# Patient Record
Sex: Male | Born: 1970 | Hispanic: Yes | Marital: Married | Smoking: Never smoker
Health system: Southern US, Community
[De-identification: ages and names within clinical notes are randomized; demographics above are authoritative.]

---

## 2000-12-22 ENCOUNTER — Other Ambulatory Visit: Admission: RE | Admit: 2000-12-22 | Discharge: 2000-12-22 | Payer: Self-pay | Admitting: *Deleted

## 2001-03-15 ENCOUNTER — Other Ambulatory Visit: Admission: RE | Admit: 2001-03-15 | Discharge: 2001-03-15 | Payer: Self-pay | Admitting: *Deleted

## 2010-12-11 DIAGNOSIS — M2550 Pain in unspecified joint: Secondary | ICD-10-CM | POA: Insufficient documentation

## 2010-12-11 DIAGNOSIS — R700 Elevated erythrocyte sedimentation rate: Secondary | ICD-10-CM | POA: Insufficient documentation

## 2010-12-16 DIAGNOSIS — R7982 Elevated C-reactive protein (CRP): Secondary | ICD-10-CM | POA: Insufficient documentation

## 2010-12-16 DIAGNOSIS — M2550 Pain in unspecified joint: Secondary | ICD-10-CM

## 2010-12-16 DIAGNOSIS — R700 Elevated erythrocyte sedimentation rate: Secondary | ICD-10-CM

## 2010-12-22 ENCOUNTER — Other Ambulatory Visit: Payer: Self-pay | Admitting: Rheumatology

## 2010-12-22 ENCOUNTER — Encounter: Payer: Self-pay | Admitting: Internal Medicine

## 2010-12-22 ENCOUNTER — Ambulatory Visit
Admission: RE | Admit: 2010-12-22 | Discharge: 2010-12-22 | Disposition: A | Discharge status: Home / Self Care | Payer: Self-pay | Source: Ambulatory Visit | Attending: Rheumatology | Admitting: Rheumatology

## 2010-12-22 ENCOUNTER — Ambulatory Visit (INDEPENDENT_AMBULATORY_CARE_PROVIDER_SITE_OTHER): Payer: BC Managed Care – PPO | Admitting: Internal Medicine

## 2010-12-22 DIAGNOSIS — M069 Rheumatoid arthritis, unspecified: Secondary | ICD-10-CM

## 2010-12-22 DIAGNOSIS — M1990 Unspecified osteoarthritis, unspecified site: Secondary | ICD-10-CM

## 2010-12-22 DIAGNOSIS — F329 Major depressive disorder, single episode, unspecified: Secondary | ICD-10-CM

## 2010-12-22 NOTE — Assessment & Plan Note (Addendum)
I agree with the provisional diagnosis of rheumatoid arthritis. It would be exceedingly unusual for her and infection to cause this protracted, 8 month illness. I agree with Deveshwar's plans to treat with anti-inflammatory therapy. He had a chest x-ray done today which showed some small granulomas in the bases. He has no history of TB or known TB exposure. He has never had a TB skin test prior to 1 been placed in her office today. I will not do any further diagnostic testing today but will be available if he turns out to have latent tuberculosis.

## 2010-12-22 NOTE — Progress Notes (Signed)
  Subjective:    Patient ID: Jeffrey Herman, male    DOB: 1971-02-26, 40 y.o.   MRN: [id number]  HPI Jeffrey Herman is a 40 year old of [place] descent who works at the [employer]. He has been bothered by bilateral diffuse aching pain in his joints since last December. He said about 25 pounds unintentional weight loss. He had one episode of subjective fever about one week ago but no other episodes of fever, chills, or sweats. He has no history of tuberculosis. He has not had any problem with cough. He has not had any problem with rash. He was referred to me by Dr. Toni Arthurs. I also note that he recently saw Dr. Pollyann Savoy who gave him a provisional diagnosis of rheumatoid arthritis based on his elevated rheumatoid factor and clinical presentation. He had a negative hepatitis panel and a negative HIV antibody in her office. He started on a prednisone taper 4 days ago and feels remarkably better. He and his wife tell me that he was unable to walk and had to be transported by wheelchair last week. Now he can walk briskly with only minimal assistance with his crutches.    Review of Systems  Constitutional: Positive for fever, activity change, appetite change, fatigue and unexpected weight change. Negative for chills and diaphoresis.  HENT: Negative for sore throat, mouth sores, neck pain and neck stiffness.   Eyes: Negative for redness and visual disturbance.  Respiratory: Negative for cough and shortness of breath.   Cardiovascular: Positive for leg swelling. Negative for chest pain.  Gastrointestinal: Negative for nausea, vomiting, abdominal pain and diarrhea.  Genitourinary: Negative for dysuria, urgency, frequency and discharge.  Musculoskeletal: Positive for myalgias, joint swelling and arthralgias.  Skin: Negative for rash.  Neurological: Positive for weakness. Negative for headaches.  Psychiatric/Behavioral: Negative.        Objective:   Physical Exam  Constitutional: No  distress.  HENT:  Mouth/Throat: Oropharynx is clear and moist. No oropharyngeal exudate.  Eyes: Conjunctivae are normal.  Neck: Neck supple.  Cardiovascular: Normal rate, regular rhythm and normal heart sounds.   No murmur heard. Pulmonary/Chest: Breath sounds normal. He has no wheezes.  Abdominal: Soft. Bowel sounds are normal. There is no tenderness.  Musculoskeletal:       He has some mild deformity of his DIP joints without any evidence of acute inflammation. His left ankle is slightly warm compared to his other joints but is not red and has no swelling.  Lymphadenopathy:    He has no cervical adenopathy.  Skin: No rash noted.  Psychiatric: He has a normal mood and affect.          Assessment & Plan:

## 2012-11-07 IMAGING — CR DG CHEST 2V
2 series · 2 of 2 positions shown · non-contrast
Comparison: None.

CLINICAL DATA: Beginning immunosuppressant drug therapy

CHEST - 2 VIEW

[w chest pa]
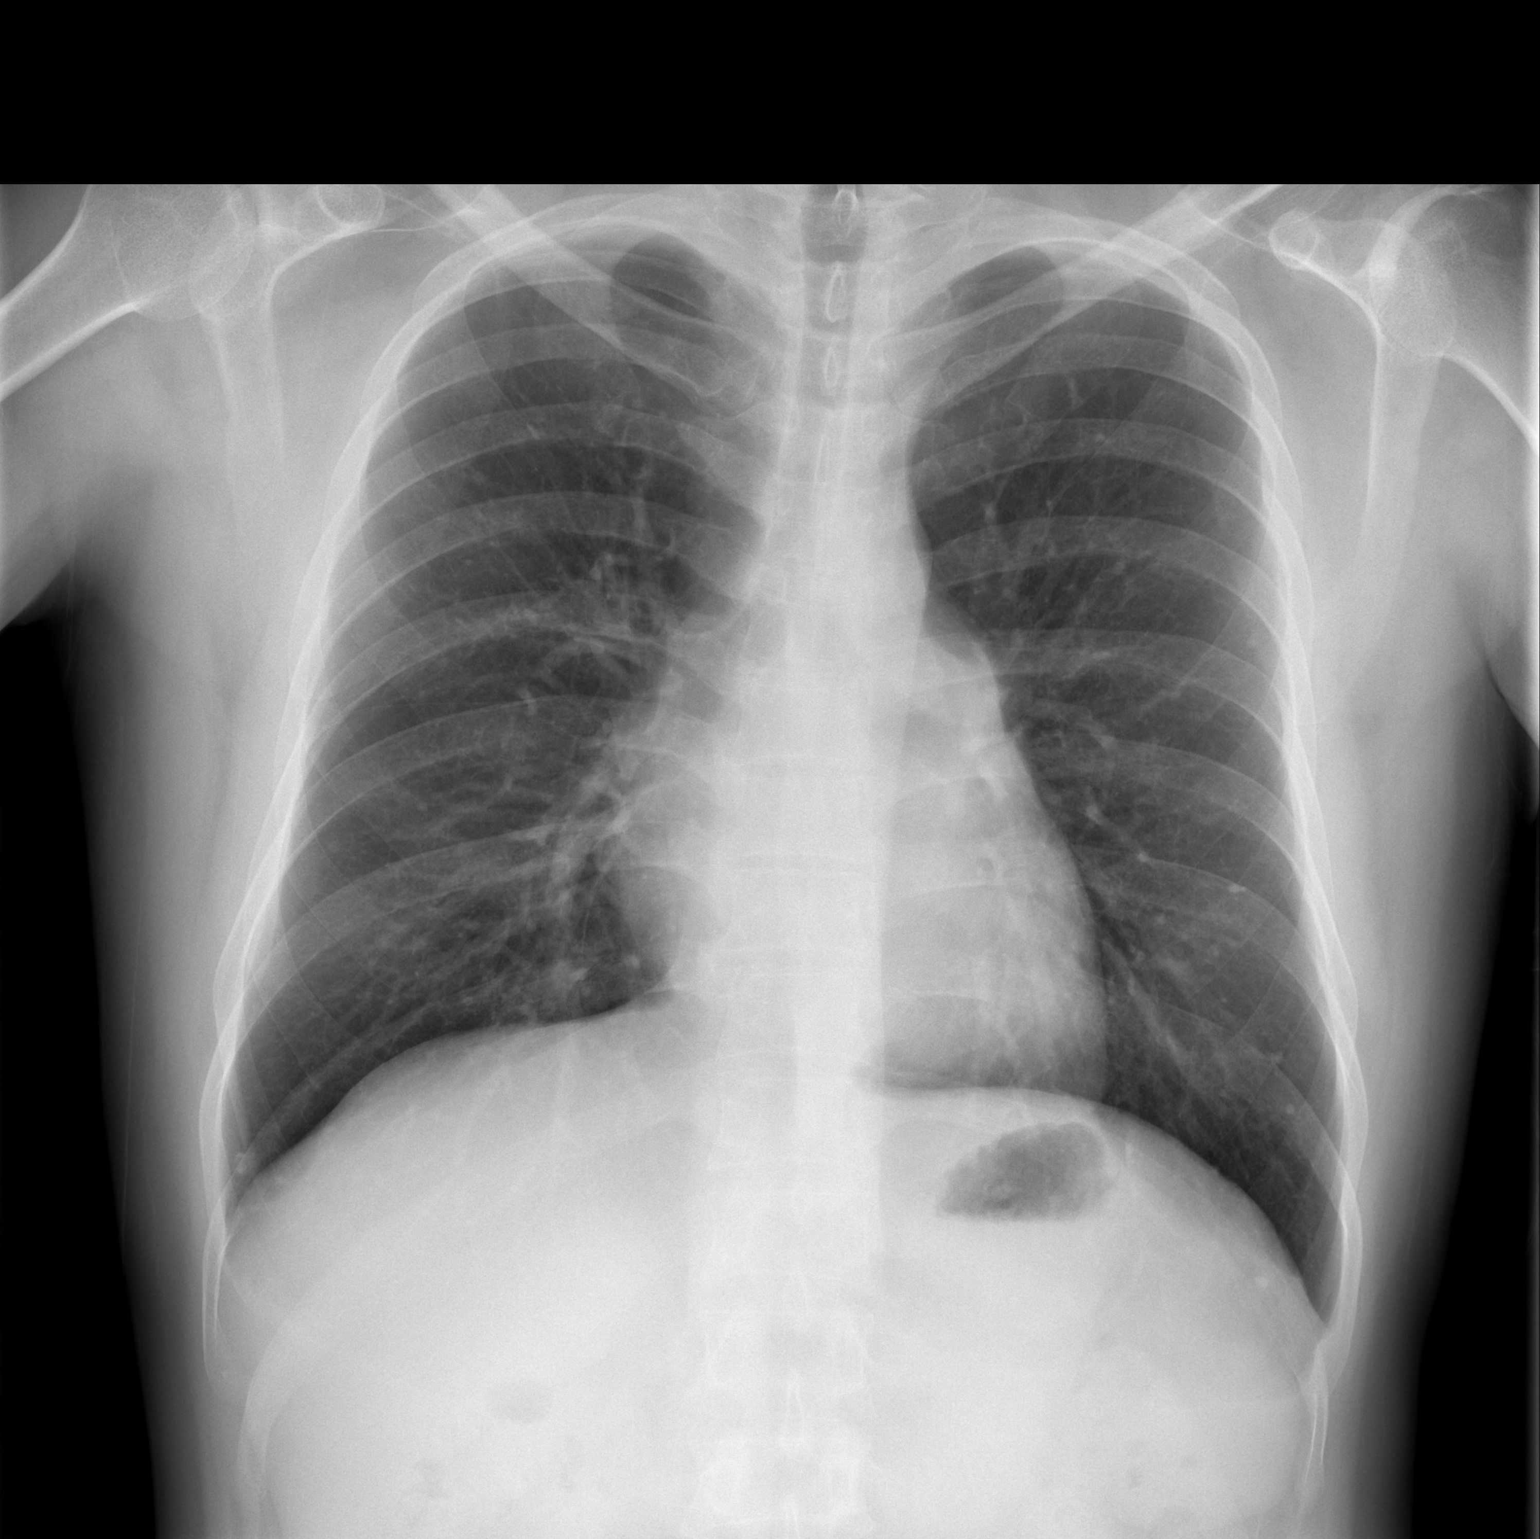

[w chest lat]
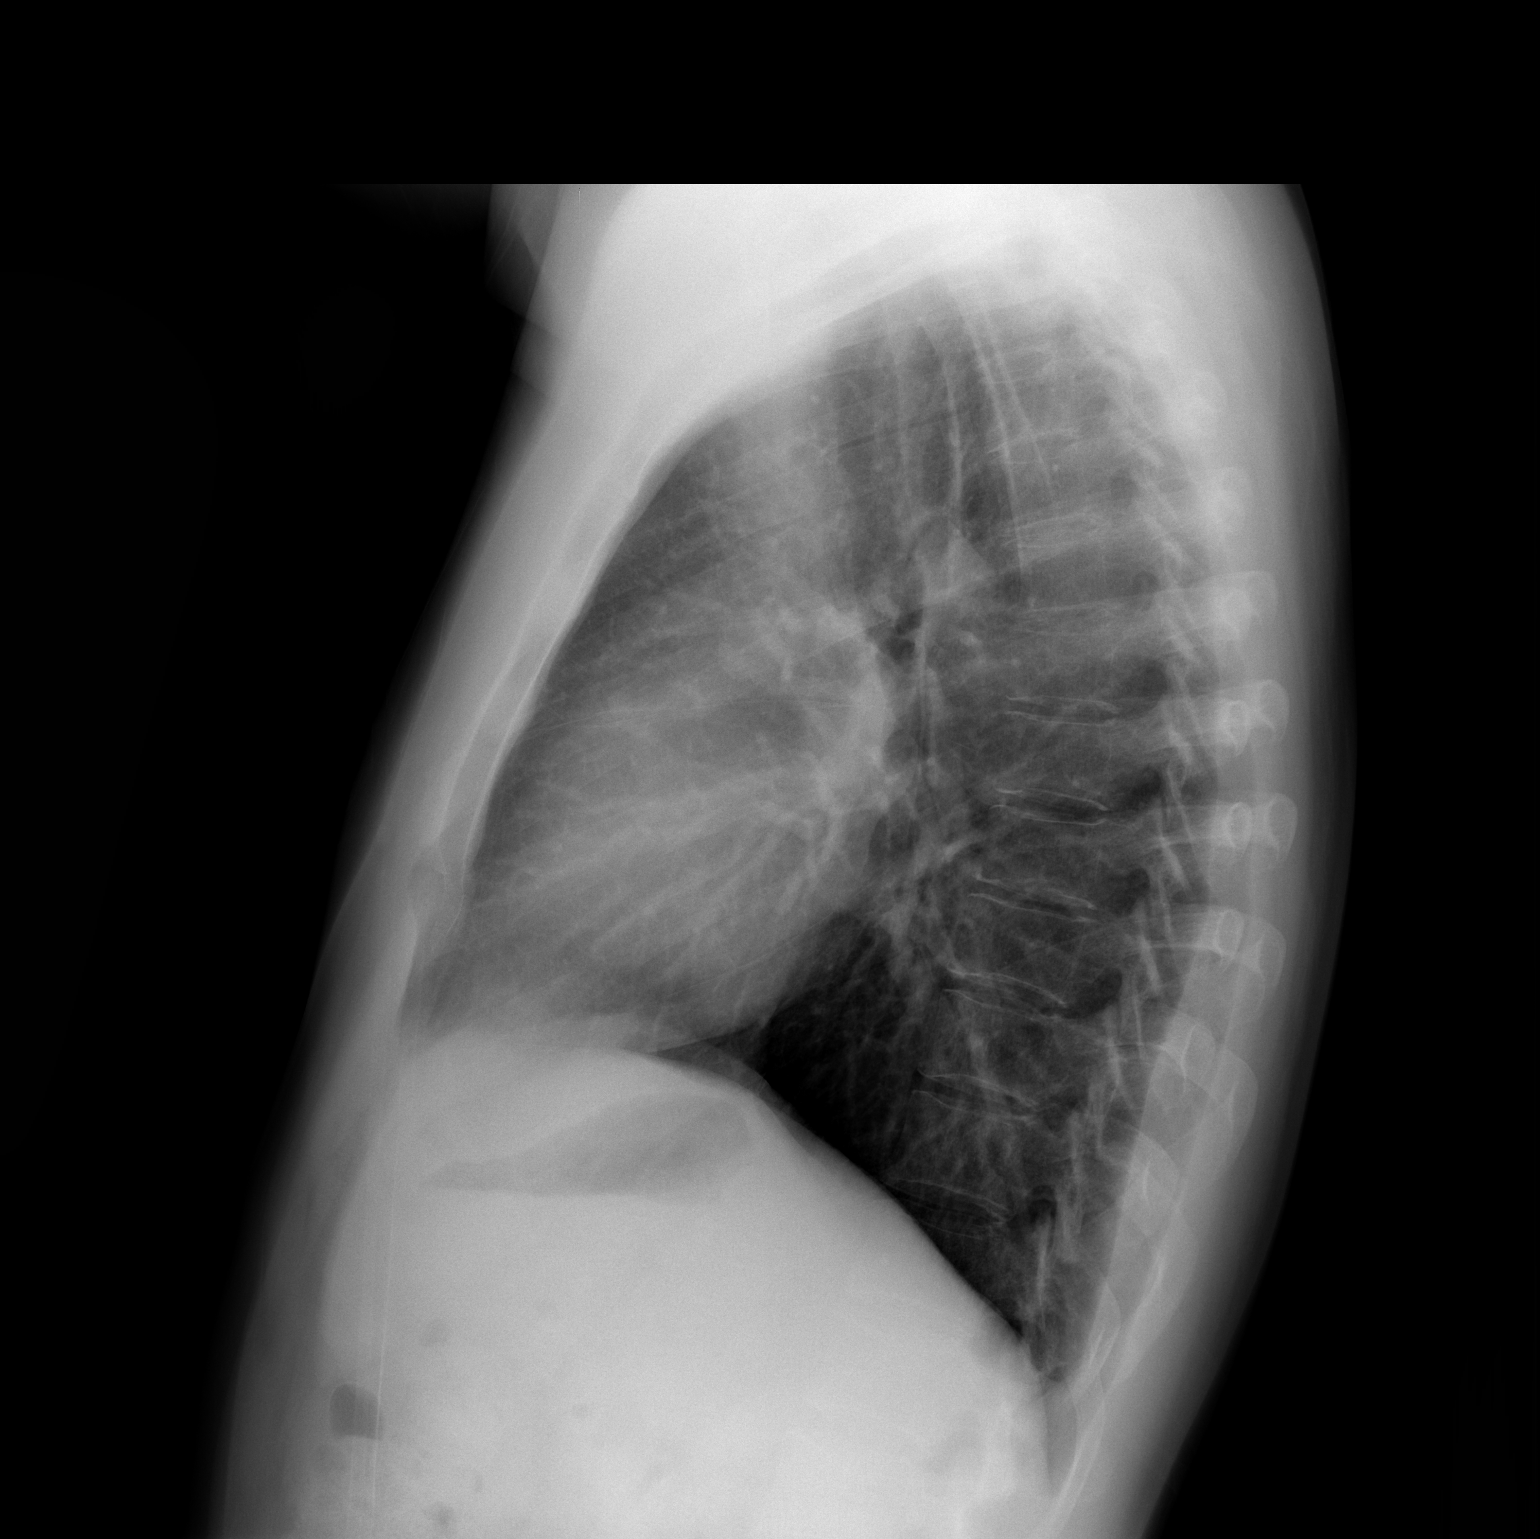

[2 of 2 positions shown; findings below may reference images not displayed]

FINDINGS: The lungs are clear.  There are small faintly calcified
nodular opacities at the lung bases left more numerous than right
most consistent with calcified granulomas  probably due to prior
granulomatous disease.  No active infiltrate or effusion is seen.
Mediastinal contours appear normal.  The heart is within normal
limits in size.  No bony abnormality is seen.
IMPRESSION: Probable small calcified granulomas at the lung bases.  No definite
active process.

## 2016-05-14 ENCOUNTER — Encounter: Payer: Self-pay | Admitting: Rheumatology

## 2016-06-16 ENCOUNTER — Other Ambulatory Visit: Payer: Self-pay | Admitting: Rheumatology

## 2016-06-24 ENCOUNTER — Other Ambulatory Visit: Payer: Self-pay | Admitting: Rheumatology

## 2016-06-24 MED ORDER — ADALIMUMAB 40 MG/0.8ML ~~LOC~~ AJKT: 0.8000 mL | AUTO-INJECTOR | SUBCUTANEOUS | 0 refills | Status: DC

## 2016-06-24 NOTE — Telephone Encounter (Signed)
Last Visit: 02/11/16 Next Visit: 07/27/16 Labs: 05/14/16 WNL TB Gold: 10/18/15 Neg  Okay to refill Humira?

## 2016-06-24 NOTE — Telephone Encounter (Signed)
[facility] called about refill request for Humira. They are requesting a call back at [phone].

## 2016-07-27 ENCOUNTER — Ambulatory Visit: Payer: Self-pay | Admitting: Rheumatology

## 2016-08-12 ENCOUNTER — Ambulatory Visit (INDEPENDENT_AMBULATORY_CARE_PROVIDER_SITE_OTHER): Payer: BLUE CROSS/BLUE SHIELD | Admitting: Rheumatology

## 2016-08-12 ENCOUNTER — Encounter: Payer: Self-pay | Admitting: Rheumatology

## 2016-08-12 VITALS — BP 131/89 | HR 75 | Resp 13 | Ht 65.0 in | Wt 149.0 lb

## 2016-08-12 DIAGNOSIS — M24521 Contracture, right elbow: Secondary | ICD-10-CM | POA: Diagnosis not present

## 2016-08-12 DIAGNOSIS — Z79899 Other long term (current) drug therapy: Secondary | ICD-10-CM

## 2016-08-12 DIAGNOSIS — M0579 Rheumatoid arthritis with rheumatoid factor of multiple sites without organ or systems involvement: Secondary | ICD-10-CM | POA: Diagnosis not present

## 2016-08-12 DIAGNOSIS — M24522 Contracture, left elbow: Secondary | ICD-10-CM

## 2016-08-12 DIAGNOSIS — M19042 Primary osteoarthritis, left hand: Secondary | ICD-10-CM

## 2016-08-12 DIAGNOSIS — M19041 Primary osteoarthritis, right hand: Secondary | ICD-10-CM | POA: Insufficient documentation

## 2016-08-12 MED ORDER — ADALIMUMAB 40 MG/0.8ML ~~LOC~~ AJKT: 0.8000 mL | AUTO-INJECTOR | SUBCUTANEOUS | 0 refills | Status: DC

## 2016-08-12 MED ORDER — SULFASALAZINE 500 MG PO TABS: 500.0000 mg | ORAL_TABLET | Freq: Four times a day (QID) | ORAL | 1 refills | Status: DC

## 2016-08-12 NOTE — Progress Notes (Signed)
Office Visit Note  Patient: Jeffrey Herman             Date of Birth: 04/21/1971           MRN: [id number]             PCP: No PCP Per Patient Referring: No ref. provider found Visit Date: 08/12/2016 Occupation: @GUAROCC @    Subjective:  Follow-up  History of Present Illness: Jeffrey Herman is a 46 y.o. male   Last seen in our office 02/11/2016.  Patient is currently doing really well with his arthritis. No joint pain swelling or stiffness beyond baseline. The combination of Humira and sulfasalazine are working well for the patient.  [facility]. His last labs were from December 2017 and have been scanned into his chart.  Activities of Daily Living:  Patient reports morning stiffness for 15 minutes.   Patient Denies nocturnal pain.  Difficulty dressing/grooming: Denies Difficulty climbing stairs: Denies Difficulty getting out of chair: Denies Difficulty using hands for taps, buttons, cutlery, and/or writing: Denies   Review of Systems  Constitutional: Negative for fatigue.  HENT: Negative for mouth sores and mouth dryness.   Eyes: Negative for dryness.  Respiratory: Negative for shortness of breath.   Gastrointestinal: Negative for constipation and diarrhea.  Musculoskeletal: Negative for myalgias and myalgias.  Skin: Negative for sensitivity to sunlight.  Neurological: Negative for memory loss.  Psychiatric/Behavioral: Negative for sleep disturbance.    PMFS History:  Patient Active Problem List   Diagnosis Date Noted  . High risk medication use 08/12/2016  . 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 Contracture of elbow joint, right 08/12/2016  . Primary osteoarthritis of both hands 08/12/2016  . Rheumatoid arthritis (HCC) 12/22/2010  . Depression 12/22/2010  . Elevated C-reactive protein (CRP) 12/16/2010  . Elevated sedimentation rate 12/11/2010  . Arthralgia of multiple sites 12/11/2010    History reviewed. No  pertinent past medical history.  History reviewed. No pertinent family history. History reviewed. No pertinent surgical history. Social History   Social History Narrative  . No narrative on file     Objective: Vital Signs: BP 131/89   Pulse 75   Resp 13   Ht 5\' 5"  (1.651 m)   Wt 149 lb (67.6 kg)   BMI 24.79 kg/m    Physical Exam  Constitutional: He is oriented to person, place, and time. He appears well-developed and well-nourished.  HENT:  Head: Normocephalic and atraumatic.  Eyes: Conjunctivae and EOM are normal. Pupils are equal, round, and reactive to light.  Neck: Normal range of motion. Neck supple.  Cardiovascular: Normal rate, regular rhythm and normal heart sounds.  Exam reveals no gallop and no friction rub.   No murmur heard. Pulmonary/Chest: Effort normal and breath sounds normal. No respiratory distress. He has no wheezes. He has no rales. He exhibits no tenderness.  Abdominal: Soft. He exhibits no distension and no mass. There is no tenderness. There is no guarding.  Musculoskeletal: Normal range of motion.  Lymphadenopathy:    He has no cervical adenopathy.  Neurological: He is alert and oriented to person, place, and time. He exhibits normal muscle tone. Coordination normal.  Skin: Skin is warm and dry. Capillary refill takes less than 2 seconds. No rash noted.  Psychiatric: He has a normal mood and affect. His behavior is normal. Judgment and thought content normal.  Vitals reviewed.    Musculoskeletal Exam:  Full range of motion of all  joints Grip strength is equal and strong bilaterally Fibromyalgia tender points are all absent  CDAI Exam: CDAI Homunculus Exam:   Joint Counts:  CDAI Tender Joint count: 0 CDAI Swollen Joint count: 0  No synovitis on examination   Investigation: Findings:  Labs from Oct 11, 2015:  TB Gold was negative.  CMP with GFR was normal.  CBC with diff was normal.  RAPID-3 shows a raw score of 0.  The patient is doing  quite well with his rheumatoid arthritis.  No visits with results within 6 Month(s) from this visit.  Latest known visit with results is:  No results found for any previous visit.    Imaging: No results found.  Speciality Comments: No specialty comments available.   Procedures:  No procedures performed Allergies: Patient has no known allergies.   Assessment / Plan:     Visit Diagnoses: Rheumatoid arthritis involving multiple sites with positive rheumatoid factor (HCC) - No synovitis on exam.  Negative CCP.  Negative ANA.  - Plan: CBC with Differential/Platelet, COMPLETE METABOLIC PANEL WITH GFR, Quantiferon tb gold assay (blood), CBC with Differential/Platelet, COMPLETE METABOLIC PANEL WITH GFR, Quantiferon tb gold assay (blood)  High risk medication use - Humira-40mg /0.8ML PNKT  every 3 weeks // sulfasalazine 2 b.i.d.// adequate response. - Plan: CBC with Differential/Platelet, COMPLETE METABOLIC PANEL WITH GFR, CBC with Differential/Platelet, COMPLETE METABOLIC PANEL WITH GFR, Quantiferon tb gold assay (blood), CBC with Differential/Platelet, COMPLETE METABOLIC PANEL WITH GFR, Quantiferon tb gold assay (blood)  Contracture of elbow joint, left  Contracture of elbow joint, right  Primary osteoarthritis of both hands - .  DIP and PIP prominence bilaterally   Plan: #1: Rheumatoid arthritis Rheumatoid factor positive, CCP negative, ANA negative. Currently on Humira every 3 weeks. On sulfasalazine 500 mg, 2 pills twice a day Both this combination of medication is giving the patient adequate response Last labs were done in [facility] on December 2017 and labs were normal  #2: High risk prescription Humira every 3 weeks (patient states that he is only getting 2 pens at a time and not lasting him for 3 months and has to call to get the remainder shipment in. I advised the patient that when we wrote for the prescription last time, it was 4 pens and asked what he should've  received. Therefore his translator today we'll get on the phone and speak with them so that patient gets 4 pens. This quantity will allow patient to have a 12 weeks supply. Sulfasalazine EN 500 mg; 2 pills twice a day.  I have refilled both of these medications for him and given him 90 day supply. Sulfasalazine also has a refill.   #3: CBC with differential and CMP with GFR today in office  #4: CBC with differential and CMP with GFR and TB Gold do late May early June and patient plans to get that done at [facility] and have them send it to [place]. I have given him hard copy of the lab request.  #5: Return to clinic in 6 months. I've asked the patient to return in 5 months and she would prefer 6 months. Since he is doing so well with his joints and the current combination of medications is adequately addressing his rheumatoid arthritis needs, 6 months is acceptable for now unless he has a flare and then we will make adjustments if needed.  Orders: Orders Placed This Encounter  Procedures  . CBC with Differential/Platelet  . COMPLETE METABOLIC PANEL WITH GFR  .  CBC with Differential/Platelet  . COMPLETE METABOLIC PANEL WITH GFR  . Quantiferon tb gold assay (blood)   Meds ordered this encounter  Medications  . Adalimumab (HUMIRA PEN) 40 MG/0.8ML PNKT    Sig: Inject 0.8 mLs into the skin every 21 ( twenty-one) days.    Dispense:  4 each    Refill:  0    Order Specific Question:   Supervising Provider    Answer:   Vanessa Kick  . sulfaSALAzine (AZULFIDINE) 500 MG tablet    Sig: Take 1 tablet (500 mg total) by mouth 4 (four) times daily.    Dispense:  360 tablet    Refill:  1    Please give enteric coated ssz en 500mg  tablet    Order Specific Question:   Supervising Provider    Answer:   Pollyann Savoy [phone]    Face-to-face time spent with patient was 30 minutes. 50% of time was spent in counseling and coordination of care.  Follow-Up Instructions:  Return in about 6 months (around 02/12/2017).   Tawni Pummel, PA-C  Note - This record has been created using [employer].  Chart creation errors have been sought, but may not always  have been located. Such creation errors do not reflect on  the standard of medical care.

## 2016-08-13 LAB — CBC WITH DIFFERENTIAL/PLATELET
BASOS ABS: 0 {cells}/uL (ref 0–200)
Basophils Relative: 0 %
EOS PCT: 3 %
Eosinophils Absolute: 186 cells/uL (ref 15–500)
HCT: 41 % (ref 38.5–50.0)
HEMOGLOBIN: 13.9 g/dL (ref 13.2–17.1)
LYMPHS ABS: 2356 {cells}/uL (ref 850–3900)
Lymphocytes Relative: 38 %
MCH: 27.8 pg (ref 27.0–33.0)
MCHC: 33.9 g/dL (ref 32.0–36.0)
MCV: 82 fL (ref 80.0–100.0)
MONOS PCT: 8 %
MPV: 10.3 fL (ref 7.5–12.5)
Monocytes Absolute: 496 cells/uL (ref 200–950)
NEUTROS ABS: 3162 {cells}/uL (ref 1500–7800)
NEUTROS PCT: 51 %
PLATELETS: 212 10*3/uL (ref 140–400)
RBC: 5 MIL/uL (ref 4.20–5.80)
RDW: 15.7 % — ABNORMAL HIGH (ref 11.0–15.0)
WBC: 6.2 10*3/uL (ref 3.8–10.8)

## 2016-08-13 LAB — COMPLETE METABOLIC PANEL WITH GFR
ALBUMIN: 4.1 g/dL (ref 3.6–5.1)
ALT: 21 U/L (ref 9–46)
AST: 26 U/L (ref 10–40)
Alkaline Phosphatase: 72 U/L (ref 40–115)
BUN: 15 mg/dL (ref 7–25)
CHLORIDE: 105 mmol/L (ref 98–110)
CO2: 24 mmol/L (ref 20–31)
Calcium: 9 mg/dL (ref 8.6–10.3)
Creat: 0.88 mg/dL (ref 0.60–1.35)
GFR, Est African American: >89 mL/min (ref >=60)
GLUCOSE: 111 mg/dL — AB (ref 65–99)
POTASSIUM: 4.3 mmol/L (ref 3.5–5.3)
SODIUM: 139 mmol/L (ref 135–146)
Total Bilirubin: 0.4 mg/dL (ref 0.2–1.2)
Total Protein: 7.8 g/dL (ref 6.1–8.1)

## 2016-09-04 ENCOUNTER — Other Ambulatory Visit: Payer: Self-pay | Admitting: Rheumatology

## 2016-09-04 MED ORDER — ADALIMUMAB 40 MG/0.8ML ~~LOC~~ AJKT: 1.0000 "pen " | AUTO-INJECTOR | SUBCUTANEOUS | 2 refills | Status: DC

## 2016-09-04 NOTE — Telephone Encounter (Signed)
08/12/16 last visit  02/11/17 next visit  Labs 08/12/16 normal TB gold due in June  Was neg June 2017 Ok to refill per Dr Corliss Skains

## 2016-09-04 NOTE — Telephone Encounter (Signed)
Patient called requesting a refill on his Humira.  [facility]  CB# [phone].  Thank you

## 2016-09-18 ENCOUNTER — Encounter: Payer: Self-pay | Admitting: Rheumatology

## 2016-09-23 ENCOUNTER — Telehealth: Payer: Self-pay | Admitting: *Deleted

## 2016-09-23 NOTE — Telephone Encounter (Signed)
Attempted to return patient's call and no answer.

## 2016-09-23 NOTE — Telephone Encounter (Signed)
Patient returning Andrea's call. °

## 2016-09-23 NOTE — Telephone Encounter (Signed)
CBC and CMP drawn on 09/18/16 WNL except non fasting glucose 115  Unable to leave patient a message.

## 2016-09-30 NOTE — Telephone Encounter (Signed)
Received TB Gold- results are negative.  Attempted to contact the patient and unable to leave a message.

## 2017-01-13 ENCOUNTER — Other Ambulatory Visit: Payer: Self-pay | Admitting: Rheumatology

## 2017-01-14 NOTE — Telephone Encounter (Signed)
08/12/16 last visit  02/11/17 next visit  Labs 09/18/16 normal

## 2017-01-15 NOTE — Telephone Encounter (Signed)
Attempted to contact the patient to advise he is due for labs. Unable to leave a message.

## 2017-01-19 NOTE — Telephone Encounter (Signed)
Attempted to contact the patient and unable to leave a message.  

## 2017-01-20 NOTE — Telephone Encounter (Signed)
[facility] calling in reference to Humira 40mg  pen. Requesting a refill phone # [phone] fax# [phone]

## 2017-01-22 NOTE — Telephone Encounter (Signed)
ok 

## 2017-01-22 NOTE — Telephone Encounter (Signed)
Patient is aware he is due for labs.   08/12/16 last visit  05/05/17 next visit  Labs 09/18/16 normal TB Gold: 09/18/16 Neg  Okay to refill 30 Supply Humira?

## 2017-02-04 ENCOUNTER — Other Ambulatory Visit: Payer: Self-pay | Admitting: Rheumatology

## 2017-02-05 NOTE — Telephone Encounter (Addendum)
08/12/16 last visit  05/05/17 next visit  Labs 09/18/16 normal TB Gold: 09/18/16 Neg  Patient is due for labs. Spoke with patient and he states he will come next week to update labs.  Okay to refill 30 supply day supply Humira?

## 2017-02-10 ENCOUNTER — Telehealth: Payer: Self-pay | Admitting: Rheumatology

## 2017-02-10 NOTE — Telephone Encounter (Signed)
Pharmacy calling requesting a refill on patients Humira  pen. Fax#[phone]

## 2017-02-10 NOTE — Telephone Encounter (Signed)
ok 

## 2017-02-11 ENCOUNTER — Ambulatory Visit: Payer: BLUE CROSS/BLUE SHIELD | Admitting: Rheumatology

## 2017-02-11 NOTE — Telephone Encounter (Signed)
Prescription sent to the pharmacy.

## 2017-03-01 ENCOUNTER — Telehealth: Payer: Self-pay | Admitting: Rheumatology

## 2017-03-01 NOTE — Telephone Encounter (Signed)
Patient going to [facility] in [place] tomorrow for labs. Please send orders.

## 2017-03-02 ENCOUNTER — Other Ambulatory Visit: Payer: Self-pay | Admitting: *Deleted

## 2017-03-02 DIAGNOSIS — Z79899 Other long term (current) drug therapy: Secondary | ICD-10-CM

## 2017-03-02 NOTE — Telephone Encounter (Signed)
Lab orders faxed.

## 2017-03-08 ENCOUNTER — Telehealth: Payer: Self-pay | Admitting: Rheumatology

## 2017-03-08 NOTE — Telephone Encounter (Signed)
Patient calling requesting lab results. Please call to advise.

## 2017-03-09 NOTE — Telephone Encounter (Signed)
Attempted to contact the patient and left message for patient to call the office.  

## 2017-03-16 ENCOUNTER — Other Ambulatory Visit: Payer: Self-pay | Admitting: Rheumatology

## 2017-03-16 NOTE — Telephone Encounter (Signed)
Attempted to contact the patient and unable to leave a message.  

## 2017-03-16 NOTE — Telephone Encounter (Signed)
Patient needs a refill on Fulfasalazind 500mg , and Humira. [facility]

## 2017-03-19 MED ORDER — SULFASALAZINE 500 MG PO TABS: 1000.0000 mg | ORAL_TABLET | Freq: Two times a day (BID) | ORAL | 0 refills | Status: DC

## 2017-03-19 MED ORDER — ADALIMUMAB 40 MG/0.4ML ~~LOC~~ PSKT: 40.0000 mg | PREFILLED_SYRINGE | SUBCUTANEOUS | 0 refills | Status: DC

## 2017-03-19 NOTE — Telephone Encounter (Signed)
Ok to give 30d

## 2017-03-19 NOTE — Telephone Encounter (Signed)
Last Visit: 08/12/16 Next Visit: 05/05/17 Labs: 03/02/17 AST 45.7 previously 26.1  TB Gold: 09/18/16 Neg  No Synovitis on last exam  Okay to refill Humira and SSZ?

## 2017-03-22 ENCOUNTER — Other Ambulatory Visit: Payer: Self-pay | Admitting: Rheumatology

## 2017-03-25 ENCOUNTER — Telehealth: Payer: Self-pay

## 2017-03-25 NOTE — Telephone Encounter (Signed)
Patient would like a Rx refill on SSZ and Humira [facility]  Cb# [phone].  Please advise.  Thank you.

## 2017-03-25 NOTE — Telephone Encounter (Signed)
Attempted to contact the patient and unable to leave a message.  

## 2017-03-29 NOTE — Telephone Encounter (Signed)
Attempted to contact the patient and unable to leave a message. Prescriptions were sent to the pharmacy on 03/19/17.

## 2017-03-30 ENCOUNTER — Other Ambulatory Visit: Payer: Self-pay | Admitting: Rheumatology

## 2017-03-30 ENCOUNTER — Telehealth: Payer: Self-pay | Admitting: Rheumatology

## 2017-03-30 NOTE — Telephone Encounter (Deleted)
Patient needs a refill on Sulfasalazine 500 mg. [facility] Please call if questions.

## 2017-03-30 NOTE — Telephone Encounter (Signed)
Opened in error

## 2017-04-01 ENCOUNTER — Telehealth: Payer: Self-pay | Admitting: Rheumatology

## 2017-04-01 NOTE — Telephone Encounter (Signed)
Daughter calling for patient in reference to rx for Sulfasalazine. Per [facility] not [facility]. Please call to advise.

## 2017-04-02 MED ORDER — SULFASALAZINE 500 MG PO TABS: 1000.0000 mg | ORAL_TABLET | Freq: Two times a day (BID) | ORAL | 0 refills | Status: DC

## 2017-04-02 NOTE — Telephone Encounter (Signed)
Prescription re-sent to pharmacy.

## 2017-04-05 ENCOUNTER — Other Ambulatory Visit: Payer: Self-pay | Admitting: Rheumatology

## 2017-04-12 ENCOUNTER — Other Ambulatory Visit: Payer: Self-pay | Admitting: Rheumatology

## 2017-04-13 NOTE — Telephone Encounter (Signed)
Last Visit: 08/12/16 Next Visit: 05/05/17 Labs: 03/02/17 AST 45.7 previously 26.1  TB Gold: 09/18/16 Neg  Okay to refill per Dr. Corliss Skainseveshwar

## 2017-04-16 ENCOUNTER — Telehealth: Payer: Self-pay

## 2017-04-16 NOTE — Telephone Encounter (Signed)
Patient was calling concerning Rx for SSZ.  Stated that he only got a Rx for a 30 day supply.  Would like a 90 day supply. CB# is [phone].  Please advise.  Thank you.

## 2017-04-19 NOTE — Telephone Encounter (Signed)
Patient advised after he will receive a 90 day supply after he comes for his appointment.

## 2017-04-24 NOTE — Progress Notes (Signed)
Office Visit Note  Patient: Jeffrey Herman             Date of Birth: 06/04/1970           MRN: [id number]             PCP: Patient, No Pcp Per Referring: No ref. provider found Visit Date: 05/05/2017 Occupation: @GUAROCC @  Interpreter: Arta Bruceeynaldo Diaz   Subjective:   left foot pain   History of Present Illness: Jeffrey Herman is a 46 y.o. male with history of sero positive rheumatoid arthritis and osteoarthritis. He states he continues to have some discomfort in his elbows. He has intermittent pain and swelling in his left foot. He states about 1-1/2 month ago he had swelling and pain in his left third toe. It is not swollen now. He states that he missed 3 doses of sulfasalazine due to insurance coverage and refills. His last Humira injection was on December 6. He has ran out of his Humira refills. Patient reports that she developed a rash on his scalp and he was given Keflex by his PCP. Is taking the medication right now.  Activities of Daily Living:  Patient reports morning stiffness for 0 minute.   Patient Denies nocturnal pain.  Difficulty dressing/grooming: Denies Difficulty climbing stairs: Denies Difficulty getting out of chair: Denies Difficulty using hands for taps, buttons, cutlery, and/or writing: Denies   Review of Systems  Constitutional: Positive for fatigue. Negative for night sweats and weakness ( ).  HENT: Negative for mouth sores, mouth dryness and nose dryness.   Eyes: Negative for redness and dryness.  Respiratory: Negative for shortness of breath and difficulty breathing.   Cardiovascular: Negative for chest pain, palpitations, hypertension, irregular heartbeat and swelling in legs/feet.  Gastrointestinal: Negative for constipation and diarrhea.  Endocrine: Negative for increased urination.  Musculoskeletal: Positive for arthralgias and joint pain. Negative for joint swelling, myalgias, muscle weakness, morning stiffness, muscle tenderness and myalgias.  Skin:  Negative for color change, rash, hair loss, nodules/bumps, skin tightness, ulcers and sensitivity to sunlight.  Allergic/Immunologic: Negative for susceptible to infections.  Neurological: Negative for dizziness, fainting, memory loss and night sweats.  Hematological: Negative for swollen glands.  Psychiatric/Behavioral: Negative for depressed mood and sleep disturbance. The patient is not nervous/anxious.     PMFS History:  Patient Active Problem List   Diagnosis Date Noted  . High risk medication use 08/12/2016  . 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 Contracture of elbow joint, right 08/12/2016  . Primary osteoarthritis of both hands 08/12/2016  . Rheumatoid arthritis (HCC) 12/22/2010  . Depression 12/22/2010  . Elevated C-reactive protein (CRP) 12/16/2010  . Elevated sedimentation rate 12/11/2010  . Arthralgia of multiple sites 12/11/2010    History reviewed. No pertinent past medical history.  History reviewed. No pertinent family history. History reviewed. No pertinent surgical history. Social History   Social History Narrative  . Not on file     Objective: Vital Signs: BP 127/82 (BP Location: Left Arm, Patient Position: Sitting, Cuff Size: Normal)   Pulse 88   Resp 15   Ht 5\' 5"  (1.651 m)   Wt 150 lb (68 kg)   BMI 24.96 kg/m    Physical Exam  Constitutional: He is oriented to person, place, and time. He appears well-developed and well-nourished.  HENT:  Head: Normocephalic and atraumatic.  Eyes: Conjunctivae and EOM are normal. Pupils are equal, round, and reactive to light.  Neck: Normal range of motion. Neck supple.  Cardiovascular: Normal  rate, regular rhythm and normal heart sounds.  Pulmonary/Chest: Effort normal and breath sounds normal.  Abdominal: Soft. Bowel sounds are normal.  Neurological: He is alert and oriented to person, place, and time.  Skin: Skin is warm and dry. Capillary refill takes less than 2 seconds.  Psychiatric: He has a  normal mood and affect. His behavior is normal.  Nursing note and vitals reviewed.    Musculoskeletal Exam: C-spine and thoracic lumbar spine good range of motion. Shoulder joints elbow joints wrist joint MCPs PIPs DIPs are good range of motion. He has some DIP PIP thickening but no synovitis was noted. Hip joints knee joints ankles MTPs PIPs DIPs with good range of motion with no synovitis. He is about 2 contracture in his left elbow and about 10 contracture in his right elbow.  CDAI Exam: CDAI Homunculus Exam:   Joint Counts:  CDAI Tender Joint count: 0 CDAI Swollen Joint count: 0  Global Assessments:  Patient Global Assessment: 5 Provider Global Assessment: 2  CDAI Calculated Score: 7    Investigation: No additional findings. Labs: 03/02/2017 elevated AST 45.7,  previous 09/18/2016 26.1 TB Gold: 09/18/2016 Negative  Imaging: No results found.  Speciality Comments: No specialty comments available.    Procedures:  No procedures performed Allergies: Patient has no known allergies.   Assessment / Plan:     Visit Diagnoses: Rheumatoid arthritis involving multiple sites with positive rheumatoid factor (HCC) - +RF, -CCP, -ANA. He is doing much better on Humira and sulfasalazine combination. He has no synovitis on examination. He states he ran out of sulfasalazine about 3 weeks and he does not have Humira refills anymore. As some clinically he is doing better and has elevation of LFTs and would like to hold off sulfasalazine for right now and keep him on Humira monotherapy.  High risk medication use - Humira 40 mg sq 21 days, SSZ 500 mg 2 tabs po bid. LFTs were elevated in October. He will get labs in January and then every 3 months to monitor for drug toxicity. His TB gold is due in May 2019. His LFTs were elevated recently. I've advised to discontinue sulfasalazine and continue with Humira. We will decrease the interval between the Humira injections to every other week. A sample for  Humira injection was given  Primary osteoarthritis of both hands: He has some stiffness in his hands.  Contracture of elbow joint, left: Better  Contracture of elbow joint, right he has tended contracture in his right elbow without discomfort.  History of depression  Psoriasis : I do not see any active lesions currently.  Scalp infection: I do not see any active lesions but she was placed on Keflex by his PCP for infection. He uses a hat at work. I advised him to avoid using hat to prevent sweating and infection.   Orders: No orders of the defined types were placed in this encounter.  No orders of the defined types were placed in this encounter.   Face-to-face time spent with patient was 30 minutes. Greater than 50% of time was spent in counseling and coordination of care.  Follow-Up Instructions: Return in about 5 months (around 10/03/2017) for Rheumatoid arthritis, Osteoarthritis.   Pollyann SavoyShaili Jamariyah Johannsen, MD  Note - This record has been created using [employer] software.  Chart creation errors have been sought, but may not always  have been located. Such creation errors do not reflect on  the standard of medical care.

## 2017-05-05 ENCOUNTER — Ambulatory Visit (INDEPENDENT_AMBULATORY_CARE_PROVIDER_SITE_OTHER): Payer: BLUE CROSS/BLUE SHIELD | Admitting: Rheumatology

## 2017-05-05 ENCOUNTER — Encounter: Payer: Self-pay | Admitting: Rheumatology

## 2017-05-05 ENCOUNTER — Telehealth: Payer: Self-pay

## 2017-05-05 VITALS — BP 127/82 | HR 88 | Resp 15 | Ht 65.0 in | Wt 150.0 lb

## 2017-05-05 DIAGNOSIS — M19042 Primary osteoarthritis, left hand: Secondary | ICD-10-CM

## 2017-05-05 DIAGNOSIS — M24521 Contracture, right elbow: Secondary | ICD-10-CM | POA: Diagnosis not present

## 2017-05-05 DIAGNOSIS — L409 Psoriasis, unspecified: Secondary | ICD-10-CM

## 2017-05-05 DIAGNOSIS — Z79899 Other long term (current) drug therapy: Secondary | ICD-10-CM

## 2017-05-05 DIAGNOSIS — M0579 Rheumatoid arthritis with rheumatoid factor of multiple sites without organ or systems involvement: Secondary | ICD-10-CM | POA: Diagnosis not present

## 2017-05-05 DIAGNOSIS — M24522 Contracture, left elbow: Secondary | ICD-10-CM | POA: Diagnosis not present

## 2017-05-05 DIAGNOSIS — Z8659 Personal history of other mental and behavioral disorders: Secondary | ICD-10-CM

## 2017-05-05 DIAGNOSIS — M19041 Primary osteoarthritis, right hand: Secondary | ICD-10-CM

## 2017-05-05 NOTE — Patient Instructions (Signed)
Standing Labs We placed an order today for your standing lab work.    Please come back and get your standing labs in January and every 3 months  We have open lab Monday through Friday from 8:30-11:30 AM and 1:30-4 PM at the office of Dr. Maks Cavallero.   The office is located at [address], [address], [place], [place] [address] No appointment is necessary.   Labs are drawn by [facility].  You may receive a bill from [facility] for your lab work. If you have any questions regarding directions or hours of operation,  please call [phone].    

## 2017-05-05 NOTE — Telephone Encounter (Signed)
Was asked to speak to patient in office about Humira Refill. He has been having problems getting it from the pharmacy.   Called patients insurance to verify coverage and prior authorization status. Spoke with Crystal who states that the pharmacy has placed a cost override in patients profile for coverage from 03/25/2017 through 03/25/2018. No prior authorization is needed at this time.  Spoke with patient to inform him of the change in directions and that a new rx would be sent to his pharmacy. Gave him the pharmacy phone number to keep track and follow up with delivery.    Called [facility] to check status. Spoke with Minerva Areolaric who states they received the Rx on 11/27 (Humira 40mg )  Per Dr.Deveshwar, patient will take Humira 40mg  every 14 days. *see office note from 05/05/17* Call was transferred to Sue LushAndrea, LPN to give a verbal Rx for patient.   Allyson Tineo, [place], CPhT 3:38 PM

## 2017-07-09 ENCOUNTER — Other Ambulatory Visit: Payer: Self-pay

## 2017-07-09 ENCOUNTER — Telehealth: Payer: Self-pay | Admitting: Rheumatology

## 2017-07-09 DIAGNOSIS — Z79899 Other long term (current) drug therapy: Secondary | ICD-10-CM

## 2017-07-09 NOTE — Telephone Encounter (Signed)
Error

## 2017-07-30 ENCOUNTER — Other Ambulatory Visit: Payer: Self-pay | Admitting: Rheumatology

## 2017-07-30 NOTE — Telephone Encounter (Signed)
Last visit: 05/05/2017 Next visit: 10/15/2017 Labs:  TB Gold: 09/18/2016 Negative  Left message to advise patient he is due for labs.

## 2017-08-03 NOTE — Telephone Encounter (Signed)
Need labs prior to refill.

## 2017-08-03 NOTE — Telephone Encounter (Signed)
Last visit: 05/05/2017 Next visit: 10/15/2017 Labs: 03/02/17 AST 45.7 previously 26 TB Gold: 09/18/2016 Negative  Left message to advise patient he is due for labs.   Okay to refill 30 day supply Humira?

## 2017-08-31 ENCOUNTER — Telehealth: Payer: Self-pay | Admitting: Rheumatology

## 2017-08-31 MED ORDER — ADALIMUMAB 40 MG/0.4ML ~~LOC~~ AJKT: 40.0000 mg | AUTO-INJECTOR | SUBCUTANEOUS | 0 refills | Status: DC

## 2017-08-31 NOTE — Telephone Encounter (Signed)
Last Visit: 05/05/17 Next visit: 10/15/17 Labs: 07/09/17 stable Tb Gold: 09/28/16 neg   Okay to refill per Dr. Corliss Skainseveshwar

## 2017-08-31 NOTE — Telephone Encounter (Signed)
Patient called requesting prescription refill of Humira.  [facility]

## 2017-09-01 ENCOUNTER — Telehealth: Payer: Self-pay | Admitting: Rheumatology

## 2017-09-01 NOTE — Telephone Encounter (Signed)
Jeffrey CavesJessie [facility] called requesting clarification on the directions of patient's Humira.  Patient called  requesting a refill and the directions have changed since the last prescription was filled so we need a return call so we can fill the prescription.   Please contact our office at  [phone]

## 2017-09-02 NOTE — Telephone Encounter (Signed)
Spoke with pharmacy. They states the Humira come 2 in a box and they can not not split the pack. Advised okay to dispense 4 pens.

## 2017-09-08 ENCOUNTER — Telehealth: Payer: Self-pay | Admitting: Rheumatology

## 2017-09-08 NOTE — Telephone Encounter (Signed)
Patient's daughter Stefanie LibelGisel called stating that Bellamy needs his Hum[facility].  Patient states [place] last month [place].

## 2017-09-09 MED ORDER — ADALIMUMAB 40 MG/0.4ML ~~LOC~~ AJKT: 40.0000 mg | AUTO-INJECTOR | SUBCUTANEOUS | 0 refills | Status: DC

## 2017-09-09 NOTE — Telephone Encounter (Signed)
Last visit: 05/05/2017 Next visit: 10/15/2017 Labs: 07/09/17 stable TB Gold: 09/18/2016 Negative  Okay to refill per Dr. Corliss Skainseveshwar

## 2017-09-13 ENCOUNTER — Telehealth: Payer: Self-pay | Admitting: Rheumatology

## 2017-09-13 ENCOUNTER — Telehealth (INDEPENDENT_AMBULATORY_CARE_PROVIDER_SITE_OTHER): Payer: Self-pay | Admitting: Rheumatology

## 2017-09-13 NOTE — Telephone Encounter (Signed)
Rep [facility] called concerning auth request for medication. The number to contact Rosann Auerbach is [phone]

## 2017-09-13 NOTE — Telephone Encounter (Signed)
Returned call. Spoke with Coralie Common who states that pt requires a prior authorization for Humira with his [employer]. A request form will be faxed to the office. Will complete fax and return for processing and update once we have a response.   Jeffrey Herman, [place], CPhT 2:55 PM

## 2017-09-13 NOTE — Telephone Encounter (Signed)
Prior authorization  Jeffrey Herman  [phone]  Adalimumab(Humira Pen)40mg  /0.40ml   [employer] Received prescription currently waiting for paperwork. [employer]  faxed over paper work Friday.

## 2017-09-14 NOTE — Telephone Encounter (Signed)
Received a fax from [employer] regarding a prior authorization approval forHUMIRA /0.4ML  from 09/13/2017 to 09/14/2018.   Reference number: [id number] Phone number: NONE  Will send document to scan center.  [facility] to update. Spoke with Lanora Manis who states that they received the authorization earlier today. They reached out to the pt to schedule shipment but have not heard back yet. They will try to reach out again.   Salih Williamson, [place], CPhT 4:25 PM

## 2017-09-24 ENCOUNTER — Telehealth: Payer: Self-pay | Admitting: Rheumatology

## 2017-09-24 MED ORDER — ADALIMUMAB 40 MG/0.4ML ~~LOC~~ AJKT: 40.0000 mg | AUTO-INJECTOR | SUBCUTANEOUS | 0 refills | Status: DC

## 2017-09-24 NOTE — Telephone Encounter (Signed)
Jeffrey Herman from [facility] called stating they need a new prescription for Humira.  Please fax to #[phone]

## 2017-09-24 NOTE — Telephone Encounter (Signed)
Last visit: 05/05/2017 Next visit: 10/15/2017 Labs:07/09/17 stable TB Gold: 09/18/2016 Negative  Okay to refill per Dr. Corliss Skains

## 2017-10-01 NOTE — Progress Notes (Deleted)
   Office Visit Note  Patient: Jeffrey Herman             Date of Birth: [birth year]           MRN: [id number]             PCP: Patient, No Pcp Per Referring: No ref. provider found Visit Date: 10/15/2017 Occupation: @    Subjective:  No chief complaint on file.   History of Present Illness: Jeffrey Herman is a 47 y.o. male ***   Activities of Daily Living:  Patient reports morning stiffness for *** {minute/hour:19697}.   Patient {ACTIONS;DENIES/REPORTS:21021675::"Denies"} nocturnal pain.  Difficulty dressing/grooming: {ACTIONS;DENIES/REPORTS:21021675::"Denies"} Difficulty climbing stairs: {ACTIONS;DENIES/REPORTS:21021675::"Denies"} Difficulty getting out of chair: {ACTIONS;DENIES/REPORTS:21021675::"Denies"} Difficulty using hands for taps, buttons, cutlery, and/or writing: {ACTIONS;DENIES/REPORTS:21021675::"Denies"}   No Rheumatology ROS completed.   PMFS History:  Patient Active Problem List   Diagnosis Date Noted  . High risk medication use 08/12/2016  . 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 Contracture of elbow joint, right 08/12/2016  . Primary osteoarthritis of both hands 08/12/2016  . Rheumatoid arthritis (HCC) 12/22/2010  . Depression 12/22/2010  . Elevated C-reactive protein (CRP) 12/16/2010  . Elevated sedimentation rate 12/11/2010  . Arthralgia of multiple sites 12/11/2010    No past medical history on file.  No family history on file. No past surgical history on file. Social History   Social History Narrative  . Not on file     Objective: Vital Signs: There were no vitals taken for this visit.   Physical Exam   Musculoskeletal Exam: ***  CDAI Exam: No CDAI exam completed.    Investigation: No additional findings. Labs: 07/09/2017 stable  TB gold: 09/18/2016 Negative  Imaging: No results found.  Speciality Comments: No specialty comments available.    Procedures:  No procedures performed Allergies: Patient has no known  allergies.   Assessment / Plan:     Visit Diagnoses: No diagnosis found.    Orders: No orders of the defined types were placed in this encounter.  No orders of the defined types were placed in this encounter.   Face-to-face time spent with patient was *** minutes. 50% of time was spent in counseling and coordination of care.  Follow-Up Instructions: No follow-ups on file.   Ellen Henri, CMA  Note - This record has been created using Animal nutritionist.  Chart creation errors have been sought, but may not always  have been located. Such creation errors do not reflect on  the standard of medical care.

## 2017-10-15 ENCOUNTER — Ambulatory Visit: Payer: BLUE CROSS/BLUE SHIELD | Admitting: Rheumatology

## 2017-10-18 NOTE — Progress Notes (Signed)
Office Visit Note  Patient: Jeffrey Herman             Date of Birth: Jan 22, 1971           MRN: [id number]             PCP: Patient, No Pcp Per Referring: No ref. provider found Visit Date: 11/01/2017 Occupation: @GUAROCC @    Interpreter:  Geologist, engineering  Subjective:  Right 2nd MCP joint pain   History of Present Illness: Jeffrey Herman is a 47 y.o. male history of seropositive rheumatoid arthritis and osteoarthritis.  Patient reports that he has been having increased discomfort in his right second MCP joint.  He states it is intermittent swelling and stiffness in this joint as well.  He states the pain comes and goes.  He states the pain started about 6 months ago but has not been progressively getting worse.  He ranks the pain is out of 10.  He states he is also having some pain in his bilateral feet but denies any swelling.  He has been injecting Humira subcutaneously every 21 days.  He is no longer taking sulfasalazine due to elevated LFTs.  He denies needing any refills of his medications at this time.  He denies any other joint pain or joint swelling at this time.  He states that about 2 months ago he developed a rash on bilateral lower extremities.  He states the rash has been itching.  He denies applying any topical creams.  He states the itching waxes and wanes.  He denies any trigger for his rash.  Activities of Daily Living:  Patient reports morning stiffness for 0 minutes.   Patient Denies nocturnal pain.  Difficulty dressing/grooming: Denies Difficulty climbing stairs: Denies Difficulty getting out of chair: Denies Difficulty using hands for taps, buttons, cutlery, and/or writing: Denies   Review of Systems  Constitutional: Negative for fatigue and night sweats.  HENT: Negative for mouth sores, trouble swallowing, trouble swallowing, mouth dryness and nose dryness.   Eyes: Negative for redness, visual disturbance and dryness.  Respiratory: Negative for cough, hemoptysis,  shortness of breath and difficulty breathing.   Cardiovascular: Negative for chest pain, palpitations, hypertension, irregular heartbeat and swelling in legs/feet.  Gastrointestinal: Negative for abdominal pain, constipation and diarrhea.  Endocrine: Negative for increased urination.  Genitourinary: Negative for painful urination and pelvic pain.  Musculoskeletal: Positive for arthralgias, joint pain and joint swelling. Negative for myalgias, muscle weakness, morning stiffness, muscle tenderness and myalgias.  Skin: Positive for rash. Negative for color change, hair loss, nodules/bumps, skin tightness, ulcers and sensitivity to sunlight.  Allergic/Immunologic: Negative for susceptible to infections.  Neurological: Negative for dizziness, fainting, light-headedness, headaches, memory loss, night sweats and weakness.  Hematological: Negative for bruising/bleeding tendency and swollen glands.  Psychiatric/Behavioral: Negative for depressed mood, confusion and sleep disturbance. The patient is not nervous/anxious.     PMFS History:  Patient Active Problem List   Diagnosis Date Noted  . High risk medication use 08/12/2016  . 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 Contracture of elbow joint, right 08/12/2016  . Primary osteoarthritis of both hands 08/12/2016  . Rheumatoid arthritis (HCC) 12/22/2010  . Depression 12/22/2010  . Elevated C-reactive protein (CRP) 12/16/2010  . Elevated sedimentation rate 12/11/2010  . Arthralgia of multiple sites 12/11/2010    History reviewed. No pertinent past medical history.  History reviewed. No pertinent family history. History reviewed. No pertinent surgical history. Social History   Social History Narrative  . Not  on file     Objective: Vital Signs: BP 124/73 (BP Location: Left Arm, Patient Position: Sitting, Cuff Size: Normal)   Pulse 89   Resp 14   Ht 5\' 5"  (1.651 m)   Wt 147 lb (66.7 kg)   BMI 24.46 kg/m    Physical Exam    Constitutional: He is oriented to person, place, and time. He appears well-developed and well-nourished.  HENT:  Head: Normocephalic and atraumatic.  Eyes: Pupils are equal, round, and reactive to light. Conjunctivae and EOM are normal.  Neck: Normal range of motion. Neck supple.  Cardiovascular: Normal rate, regular rhythm and normal heart sounds.  Pulmonary/Chest: Effort normal and breath sounds normal.  Abdominal: Soft. Bowel sounds are normal.  Lymphadenopathy:    He has no cervical adenopathy.  Neurological: He is alert and oriented to person, place, and time.  Skin: Skin is warm and dry. Capillary refill takes less than 2 seconds.  Psoriasis patches scattered on lower extremities.  Psychiatric: He has a normal mood and affect. His behavior is normal.  Nursing note and vitals reviewed.    Musculoskeletal Exam: C-spine, thoracic spine, lumbar spine good range of motion.  No midline spinal tenderness.  No SI joint tenderness.  Shoulder joints, wrist joints, MCPs, PIPs, DIPs good range of motion with no synovitis.  Bilateral elbow joint contractures.  He has mild tenderness of the right second MCP joint.  He has PIP and DIP synovial thickening consistent with osteoarthritis of bilateral hands.  Hip joints, knee joints, ankle joints, MTPs, PIPs, DIPs good range of motion with no synovitis.  No warmth or effusion of bilateral knee joints.  No tenderness of trochanteric bursa bilaterally.    CDAI Exam: CDAI Homunculus Exam:   Joint Counts:  CDAI Tender Joint count: 0 CDAI Swollen Joint count: 0  Global Assessments:  Patient Global Assessment: 3 Provider Global Assessment: 3  CDAI Calculated Score: 6    Investigation: No additional findings. CBC Latest Ref Rng & Units 08/12/2016  WBC 3.8 - 10.8 K/uL 6.2  Hemoglobin 13.2 - 17.1 g/dL 16.113.9  Hematocrit 09.638.5 - 50.0 % 41.0  Platelets 140 - 400 K/uL 212   CMP Latest Ref Rng & Units 08/12/2016  Glucose 65 - 99 mg/dL 045(W111(H)  BUN 7 -  25 mg/dL 15  Creatinine 0.980.60 - 1.191.35 mg/dL 1.470.88  Sodium 829135 - 562146 mmol/L 139  Potassium 3.5 - 5.3 mmol/L 4.3  Chloride 98 - 110 mmol/L 105  CO2 20 - 31 mmol/L 24  Calcium 8.6 - 10.3 mg/dL 9.0  Total Protein 6.1 - 8.1 g/dL 7.8  Total Bilirubin 0.2 - 1.2 mg/dL 0.4  Alkaline Phos 40 - 115 U/L 72  AST 10 - 40 U/L 26  ALT 9 - 46 U/L 21     Imaging: No results found.  Speciality Comments: No specialty comments available.    Procedures:  No procedures performed Allergies: Patient has no known allergies.   Assessment / Plan:     Visit Diagnoses: Rheumatoid arthritis involving multiple sites with positive rheumatoid factor (HCC): RF positive, CCP negative, ANA negative: He has no active synovitis on exam.  He has not had any recent rheumatoid arthritis flares.  He has some tenderness of the right second MCP joint.  He denies any other joint pain or joint swelling at this time.  He will continue to inject Humira subcutaneously every 21 days.  He is no longer taking sulfasalazine.  He does not need any refills today.  High  risk medication use -  Humira 40 mg sq 21 days,LFTs were elevated in October -we will check CBC, CMP, and TB gold today.  Plan: CBC with Differential/Platelet, COMPLETE METABOLIC PANEL WITH GFR, QuantiFERON-TB Gold Plus  Primary osteoarthritis of both hands: He has PIP and DIP synovial thickening consistent with osteoarthritis of bilateral hands.  Joint protection and muscle strengthening were discussed.  Contracture of elbow joint, right: He has about 10 degrees flexion contracture of right elbow.  Contracture of elbow joint, left: Very mild contracture.   Psoriasis: He has psoriasis patches scattered on his lower extremities as well as his scalp.  He has not applying anything topically.  History of depression    Orders: Orders Placed This Encounter  Procedures  . CBC with Differential/Platelet  . COMPLETE METABOLIC PANEL WITH GFR  . QuantiFERON-TB Gold Plus    Meds ordered this encounter  Medications  . clobetasol cream (TEMOVATE) 0.05 %    Sig: Apply 1 application topically 2 (two) times daily.    Dispense:  60 g    Refill:  0     Follow-Up Instructions: Return in about 5 months (around 04/03/2018) for Rheumatoid arthritis, Osteoarthritis.   Gearldine Bienenstock, PA-C  Note - This record has been created using Dragon software.  Chart creation errors have been sought, but may not always  have been located. Such creation errors do not reflect on  the standard of medical care.

## 2017-11-01 ENCOUNTER — Ambulatory Visit (INDEPENDENT_AMBULATORY_CARE_PROVIDER_SITE_OTHER): Payer: 59 | Admitting: Physician Assistant

## 2017-11-01 ENCOUNTER — Encounter: Payer: Self-pay | Admitting: Physician Assistant

## 2017-11-01 VITALS — BP 124/73 | HR 89 | Resp 14 | Ht 65.0 in | Wt 147.0 lb

## 2017-11-01 DIAGNOSIS — Z8659 Personal history of other mental and behavioral disorders: Secondary | ICD-10-CM

## 2017-11-01 DIAGNOSIS — L409 Psoriasis, unspecified: Secondary | ICD-10-CM

## 2017-11-01 DIAGNOSIS — M0579 Rheumatoid arthritis with rheumatoid factor of multiple sites without organ or systems involvement: Secondary | ICD-10-CM | POA: Diagnosis not present

## 2017-11-01 DIAGNOSIS — M24521 Contracture, right elbow: Secondary | ICD-10-CM | POA: Diagnosis not present

## 2017-11-01 DIAGNOSIS — Z79899 Other long term (current) drug therapy: Secondary | ICD-10-CM

## 2017-11-01 DIAGNOSIS — M19041 Primary osteoarthritis, right hand: Secondary | ICD-10-CM

## 2017-11-01 DIAGNOSIS — M19042 Primary osteoarthritis, left hand: Secondary | ICD-10-CM | POA: Diagnosis not present

## 2017-11-01 DIAGNOSIS — M24522 Contracture, left elbow: Secondary | ICD-10-CM | POA: Diagnosis not present

## 2017-11-01 MED ORDER — CLOBETASOL PROPIONATE 0.05 % EX CREA: 1.0000 "application " | TOPICAL_CREAM | Freq: Two times a day (BID) | CUTANEOUS | 0 refills | Status: DC

## 2017-11-02 NOTE — Progress Notes (Signed)
CBC WNL. Glucose is 123.

## 2018-02-15 LAB — COMPLETE METABOLIC PANEL WITH GFR
AG RATIO: 1.1 (calc) (ref 1.0–2.5)
ALBUMIN MSPROF: 4 g/dL (ref 3.6–5.1)
ALKALINE PHOSPHATASE (APISO): 105 U/L (ref 40–115)
ALT: 22 U/L (ref 9–46)
AST: 22 U/L (ref 10–40)
BILIRUBIN TOTAL: 0.3 mg/dL (ref 0.2–1.2)
BUN: 15 mg/dL (ref 7–25)
CHLORIDE: 104 mmol/L (ref 98–110)
CO2: 27 mmol/L (ref 20–32)
Calcium: 9.1 mg/dL (ref 8.6–10.3)
Creat: 0.82 mg/dL (ref 0.60–1.35)
GFR, EST AFRICAN AMERICAN: 121 mL/min/{1.73_m2} (ref >=60)
GFR, Est Non African American: 105 mL/min/{1.73_m2} (ref >=60)
GLOBULIN: 3.8 g/dL — AB (ref 1.9–3.7)
GLUCOSE: 123 mg/dL — AB (ref 65–99)
POTASSIUM: 4.1 mmol/L (ref 3.5–5.3)
SODIUM: 138 mmol/L (ref 135–146)
TOTAL PROTEIN: 7.8 g/dL (ref 6.1–8.1)

## 2018-02-15 LAB — CBC WITH DIFFERENTIAL/PLATELET
Basophils Absolute: 50 cells/uL (ref 0–200)
Basophils Relative: 0.6 %
Eosinophils Absolute: 319 cells/uL (ref 15–500)
Eosinophils Relative: 3.8 %
HCT: 41.7 % (ref 38.5–50.0)
Hemoglobin: 14.7 g/dL (ref 13.2–17.1)
LYMPHS ABS: 2386 {cells}/uL (ref 850–3900)
MCH: 28.3 pg (ref 27.0–33.0)
MCHC: 35.3 g/dL (ref 32.0–36.0)
MCV: 80.2 fL (ref 80.0–100.0)
MPV: 9.6 fL (ref 7.5–12.5)
Monocytes Relative: 7.1 %
NEUTROS PCT: 60.1 %
Neutro Abs: 5048 cells/uL (ref 1500–7800)
PLATELETS: 252 10*3/uL (ref 140–400)
RBC: 5.2 10*6/uL (ref 4.20–5.80)
RDW: 12.4 % (ref 11.0–15.0)
TOTAL LYMPHOCYTE: 28.4 %
WBC: 8.4 10*3/uL (ref 3.8–10.8)
WBCMIX: 596 {cells}/uL (ref 200–950)

## 2018-02-15 LAB — QUANTIFERON-TB GOLD PLUS
NIL: 0.03 [IU]/mL
QuantiFERON-TB Gold Plus: NEGATIVE
TB1-NIL: 0.01 IU/mL
TB2-NIL: 0.03 IU/mL

## 2018-02-15 NOTE — Progress Notes (Signed)
TB gold negative

## 2018-03-23 NOTE — Progress Notes (Deleted)
Office Visit Note  Patient: Jeffrey Herman             Date of Birth: [birth year]           MRN: [id number]             PCP: Patient, No Pcp Per Referring: No ref. provider found Visit Date: 04/04/2018 Occupation: @GUAROCC @  Subjective:  No chief complaint on file.  **Ask about adherence, last refill on 09/24/17 for 3 but maybe they gave him 3 kits not pens.**  Current regimen includes Humira 40mg  every 21 days.  Last TB gold negative 11/01/17.  Most recent CBC/CMP stable on 11/01/17.  Due for CBC/CMP today and then every 3 months.  Standing orders are in place.  Recommend annual flu and Pneumovax 23 as indicated.  History of Present Illness: Jeffrey Herman is a 47 y.o. male with history of seropositive rheumatoid arthritis and osteoarthritis.  Activities of Daily Living:  Patient reports morning stiffness for *** {minute/hour:19697}.   Patient {ACTIONS;DENIES/REPORTS:21021675::"Denies"} nocturnal pain.  Difficulty dressing/grooming: {ACTIONS;DENIES/REPORTS:21021675::"Denies"} Difficulty climbing stairs: {ACTIONS;DENIES/REPORTS:21021675::"Denies"} Difficulty getting out of chair: {ACTIONS;DENIES/REPORTS:21021675::"Denies"} Difficulty using hands for taps, buttons, cutlery, and/or writing: {ACTIONS;DENIES/REPORTS:21021675::"Denies"}  No Rheumatology ROS completed.   PMFS History:  Patient Active Problem List   Diagnosis Date Noted  . High risk medication use 08/12/2016  . 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 Contracture of elbow joint, right 08/12/2016  . Primary osteoarthritis of both hands 08/12/2016  . Rheumatoid arthritis (HCC) 12/22/2010  . Depression 12/22/2010  . Elevated C-reactive protein (CRP) 12/16/2010  . Elevated sedimentation rate 12/11/2010  . Arthralgia of multiple sites 12/11/2010    No past medical history on file.  No family history on file. No past surgical history on file. Social History   Social History Narrative  . Not on file     Objective: Vital Signs: There were no vitals taken for this visit.   Physical Exam   Musculoskeletal Exam: ***  CDAI Exam: CDAI Score: Not documented Patient Global Assessment: Not documented; Provider Global Assessment: Not documented Swollen: Not documented; Tender: Not documented Joint Exam   Not documented   There is currently no information documented on the homunculus. Go to the Rheumatology activity and complete the homunculus joint exam.  Investigation: No additional findings.  Imaging: No results found.  Recent Labs: Lab Results  Component Value Date   WBC 8.4 11/01/2017   HGB 14.7 11/01/2017   PLT 252 11/01/2017   NA 138 11/01/2017   K 4.1 11/01/2017   CL 104 11/01/2017   CO2 27 11/01/2017   GLUCOSE 123 (H) 11/01/2017   BUN 15 11/01/2017   CREATININE 0.82 11/01/2017   BILITOT 0.3 11/01/2017   ALKPHOS 72 08/12/2016   AST 22 11/01/2017   ALT 22 11/01/2017   PROT 7.8 11/01/2017   ALBUMIN 4.1 08/12/2016   CALCIUM 9.1 11/01/2017   GFRAA 121 11/01/2017   QFTBGOLDPLUS NEGATIVE 11/01/2017    Speciality Comments: No specialty comments available.  Procedures:  No procedures performed Allergies: Patient has no known allergies.   Assessment / Plan:     Visit Diagnoses: Rheumatoid arthritis involving multiple sites with positive rheumatoid factor (HCC) - RF positive, CCP negative, ANA negative  High risk medication use - Humira 40 mg sq 21 days,LFTs were elevated in October   Primary osteoarthritis of both hands  Contracture of elbow joint, right  Contracture of elbow joint, left  Psoriasis  History of depression   Orders: No orders of the  defined types were placed in this encounter.  No orders of the defined types were placed in this encounter.   Face-to-face time spent with patient was *** minutes. Greater than 50% of time was spent in counseling and coordination of care.  Follow-Up Instructions: No follow-ups on file.   Gearldine Bienenstock, PA-C  Note - This record has been created using Dragon software.  Chart creation errors have been sought, but may not always  have been located. Such creation errors do not reflect on  the standard of medical care.

## 2018-04-04 ENCOUNTER — Ambulatory Visit: Payer: 59 | Admitting: Physician Assistant

## 2018-04-04 NOTE — Progress Notes (Signed)
Office Visit Note  Patient: Jeffrey Herman             Date of Birth: 1970/09/07           MRN: [id number]             PCP: Patient, No Pcp Per Referring: No ref. provider found Visit Date: 04/11/2018 Occupation: @GUAROCC @  Interpreter: Mack Hook  Subjective:  Left 3rd toe pain   History of Present Illness: Jeffrey Herman is a 47 y.o. male with history of seropositive rheumatoid arthritis and osteoarthritis. He is on Humira 40 mg sq injections every 3 weeks.  He uses Temovate cream BID PRN for psoriasis.  He has recently been in [place] and returned at the beginning of October 2019.  He lost his insurance while in [place], so he has been off of Humira for 2 months.  He is having pain and swelling in the left 3rd toe. He denies any injury.  He denies any plantar fasciitis or achilles tendonitis. He denies any SI joint pain.  He denies any other joint pain or joint swelling. His psoriasis has been flaring since being off of Humira.  He has been using Temovate cream topically, but he has not noticed much improvement.   Activities of Daily Living:  Patient reports morning stiffness for 0 minutes.   Patient Denies nocturnal pain.  Difficulty dressing/grooming: Denies Difficulty climbing stairs: Denies Difficulty getting out of chair: Denies Difficulty using hands for taps, buttons, cutlery, and/or writing: Denies  Review of Systems  Constitutional: Negative for fatigue and night sweats.  HENT: Negative for mouth sores, trouble swallowing, trouble swallowing, mouth dryness and nose dryness.   Eyes: Negative for pain, redness, itching and dryness.  Respiratory: Negative for cough, hemoptysis, shortness of breath, wheezing and difficulty breathing.   Cardiovascular: Negative for chest pain, palpitations, hypertension, irregular heartbeat and swelling in legs/feet.  Gastrointestinal: Negative for abdominal pain, blood in stool, constipation, diarrhea, nausea and vomiting.  Endocrine: Negative  for increased urination.  Genitourinary: Negative for painful urination, pelvic pain and urgency.  Musculoskeletal: Positive for arthralgias, joint pain and joint swelling. Negative for myalgias, muscle weakness, morning stiffness, muscle tenderness and myalgias.  Skin: Positive for rash. Negative for color change, hair loss, nodules/bumps, skin tightness, ulcers and sensitivity to sunlight.  Allergic/Immunologic: Negative for susceptible to infections.  Neurological: Negative for dizziness, fainting, light-headedness, headaches, memory loss, night sweats and weakness.  Hematological: Negative for bruising/bleeding tendency and swollen glands.  Psychiatric/Behavioral: Negative for depressed mood, confusion and sleep disturbance. The patient is not nervous/anxious.     PMFS History:  Patient Active Problem List   Diagnosis Date Noted  . High risk medication use 08/12/2016  . 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 Contracture of elbow joint, right 08/12/2016  . Primary osteoarthritis of both hands 08/12/2016  . Rheumatoid arthritis (HCC) 12/22/2010  . Depression 12/22/2010  . Elevated C-reactive protein (CRP) 12/16/2010  . Elevated sedimentation rate 12/11/2010  . Arthralgia of multiple sites 12/11/2010    History reviewed. No pertinent past medical history.  History reviewed. No pertinent family history. History reviewed. No pertinent surgical history. Social History   Social History Narrative  . Not on file    Objective: Vital Signs: BP 123/89 (BP Location: Left Arm, Patient Position: Sitting, Cuff Size: Normal)   Pulse 92   Resp 13   Ht 5\' 5"  (1.651 m)   Wt 141 lb (64 kg)   BMI 23.46 kg/m    Physical  Exam  Constitutional: He is oriented to person, place, and time. He appears well-developed and well-nourished.  HENT:  Head: Normocephalic and atraumatic.  Eyes: Pupils are equal, round, and reactive to light. Conjunctivae and EOM are normal.  Neck: Normal range of  motion. Neck supple.  Cardiovascular: Normal rate, regular rhythm and normal heart sounds.  Pulmonary/Chest: Effort normal and breath sounds normal.  Abdominal: Soft. Bowel sounds are normal.  Lymphadenopathy:    He has no cervical adenopathy.  Neurological: He is alert and oriented to person, place, and time.  Skin: Skin is warm and dry. Capillary refill takes less than 2 seconds.  Psychiatric: He has a normal mood and affect. His behavior is normal.  Nursing note and vitals reviewed.    Musculoskeletal Exam: C-spine, thoracic spine, lumbar spine good range of motion.  No midline spinal tenderness.  No SI joint tenderness.  Shoulder joints, elbow joints, wrist joints, MCPs, PIPs and DIPs good range of motion with no synovitis.  PIP and DIP synovial thickening consistent with osteoarthritis of bilateral hands.  He has complete fist formation bilaterally.  Hip joints, knee joints, ankle joints, MTPs and PIPs and DIPs good range of motion no synovitis.  No tenderness of trochanteric bursa bilaterally. No warmth or effusion bilateral knee joints.  No tenderness or swelling of ankle joints.  No Achilles tendinitis or plantar fasciitis.  He has dactylitis of the left third toe.  CDAI Exam: CDAI Score: Not documented Patient Global Assessment: Not documented; Provider Global Assessment: Not documented Swollen: 1 ; Tender: 1  Joint Exam      Right  Left  PIP 3 (toe)     Swollen Tender     Investigation: No additional findings.  Imaging: No results found.  Recent Labs: Lab Results  Component Value Date   WBC 8.4 11/01/2017   HGB 14.7 11/01/2017   PLT 252 11/01/2017   NA 138 11/01/2017   K 4.1 11/01/2017   CL 104 11/01/2017   CO2 27 11/01/2017   GLUCOSE 123 (H) 11/01/2017   BUN 15 11/01/2017   CREATININE 0.82 11/01/2017   BILITOT 0.3 11/01/2017   ALKPHOS 72 08/12/2016   AST 22 11/01/2017   ALT 22 11/01/2017   PROT 7.8 11/01/2017   ALBUMIN 4.1 08/12/2016   CALCIUM 9.1  11/01/2017   GFRAA 121 11/01/2017   QFTBGOLDPLUS NEGATIVE 11/01/2017    Speciality Comments: No specialty comments available.  Procedures:  No procedures performed Allergies: Patient has no known allergies.   Assessment / Plan:     Visit Diagnoses: Rheumatoid arthritis involving multiple sites with positive rheumatoid factor (HCC) - RF positive, CCP negative, ANA negative: He has active synovitis of the left 3rd MCP and PIP joints.  He has been off of Humira for 2 months since he recently returned from [place] in October 2019.  He has no other joint pain or joint swelling at this time.  His psoriasis has been flaring while off of Humira. He has no SI joint tenderness, achilles tendonitis, or plantar fasciitis.  We will check TB gold, CBC, and CMP today before he restarts on Humira.  He was given 2 samples of Humira in the office today to take with him in a cooler. He will inject Humira 40 mg sq every 14 days. A refill of Humira will be sent to the pharmacy once approved. He was given a copay card. He was also given a work note.  He was advised to notify us if he continues to  have joint pain and joint swelling once he restarts on Humira.  He will follow up in 5 months.    High risk medication use - Humira 40 mg sq 14 days - CBC, CMP, and TB gold were drawn today.  He will return for lab work in February and every 3 months. Plan: COMPLETE METABOLIC PANEL WITH GFR, CBC with Differential/Platelet, QuantiFERON-TB Gold Plus  Primary osteoarthritis of both hands: He has PIP and DIP synovial thickening consistent with osteoarthritis.  He has no synovitis.  He has complete fist formation bilaterally.  Joint protection and muscle strengthening were discussed.   Contracture of elbow joint, left: Chronic.  No tenderness or synovitis.   Contracture of elbow joint, right: Chronic.  No tenderness or synovitis.    Psoriasis: His psoriasis has been flaring since being off of Humira for 2 months.  He was  encouraged to use Temovate cream topically BID PRN.  We also discussed that once he restarts on Humira his psoriasis should improve.   History of depression   Orders: Orders Placed This Encounter  Procedures  . COMPLETE METABOLIC PANEL WITH GFR  . CBC with Differential/Platelet  . QuantiFERON-TB Gold Plus   No orders of the defined types were placed in this encounter.   Face-to-face time spent with patient was 30 minutes. Greater than 50% of time was spent in counseling and coordination of care.  Follow-Up Instructions: Return in about 5 months (around 09/10/2018) for Rheumatoid arthritis, Osteoarthritis.   Sherron Ales PA-C  I examined and evaluated the patient with Sherron Ales PA.  Patient is having a flare with the increased pain and discomfort in his joints as he has been off Humira.  He is also having a flare of psoriasis as well.  We gave him some Humira samples to start treatment and will refill his prescription today.  The plan of care was discussed as noted above.  Pollyann Savoy, MD  Note - This record has been created using Animal nutritionist.  Chart creation errors have been sought, but may not always  have been located. Such creation errors do not reflect on  the standard of medical care.

## 2018-04-05 ENCOUNTER — Telehealth: Payer: Self-pay | Admitting: Rheumatology

## 2018-04-05 NOTE — Telephone Encounter (Signed)
Patient called requesting prescription refill of Hum[facility].

## 2018-04-06 MED ORDER — ADALIMUMAB 40 MG/0.4ML ~~LOC~~ AJKT: 40.0000 mg | AUTO-INJECTOR | SUBCUTANEOUS | 0 refills | Status: DC

## 2018-04-06 NOTE — Telephone Encounter (Signed)
Last Visit: 11/01/17 Next Visit: 04/11/18 Labs: 11/01/17 CBC WNL. Glucose is 123. Tb Gold: 11/01/17 Neg   Okay to refill 30 day supply per Dr. Corliss Skainseveshwar

## 2018-04-06 NOTE — Addendum Note (Signed)
Addended by: Henriette CombsHATTON, Bridney Guadarrama L on: 04/06/2018 11:04 AM   Modules accepted: Orders

## 2018-04-11 ENCOUNTER — Telehealth: Payer: Self-pay | Admitting: Pharmacy Technician

## 2018-04-11 ENCOUNTER — Encounter: Payer: Self-pay | Admitting: Rheumatology

## 2018-04-11 ENCOUNTER — Ambulatory Visit (INDEPENDENT_AMBULATORY_CARE_PROVIDER_SITE_OTHER): Payer: 59 | Admitting: Rheumatology

## 2018-04-11 VITALS — BP 123/89 | HR 92 | Resp 13 | Ht 65.0 in | Wt 141.0 lb

## 2018-04-11 DIAGNOSIS — Z79899 Other long term (current) drug therapy: Secondary | ICD-10-CM

## 2018-04-11 DIAGNOSIS — M24522 Contracture, left elbow: Secondary | ICD-10-CM | POA: Diagnosis not present

## 2018-04-11 DIAGNOSIS — M19041 Primary osteoarthritis, right hand: Secondary | ICD-10-CM

## 2018-04-11 DIAGNOSIS — L409 Psoriasis, unspecified: Secondary | ICD-10-CM

## 2018-04-11 DIAGNOSIS — M0579 Rheumatoid arthritis with rheumatoid factor of multiple sites without organ or systems involvement: Secondary | ICD-10-CM | POA: Diagnosis not present

## 2018-04-11 DIAGNOSIS — M19042 Primary osteoarthritis, left hand: Secondary | ICD-10-CM

## 2018-04-11 DIAGNOSIS — M24521 Contracture, right elbow: Secondary | ICD-10-CM

## 2018-04-11 DIAGNOSIS — Z8659 Personal history of other mental and behavioral disorders: Secondary | ICD-10-CM

## 2018-04-11 NOTE — Telephone Encounter (Signed)
Received a Prior Authorization request from [employer] for Humira. Authorization has been submitted to patient's insurance via Cover My Meds. Will update once we receive a response.  12:07 PM Dorthula Nettlesachael N Herman Mell, CPhT

## 2018-04-11 NOTE — Progress Notes (Signed)
Pharmacy Note Medication Samples have been provided to the patient as he lost insurance coverage for some time.  Drug name: Humira Strength: 40 mg/0.724ml      Qty: 3   LOT: [id number]   Exp.Date: 01/2019  Dosing instructions: Inject every 14 days.  The patient has been instructed regarding the correct time, dose, and frequency of taking this medication, including desired effects and most common side effects.  Instructed patient that the frequency was increased due to his flare.  He asked for a stronger cream but he is already on Temovate cream.  Rachael verified current insurance coverage. Will require a PA and must use [facility].  Patient given co-pay card to help with coverage.  Also given patient assistance forms as an option depending on coverage.   All questions encouraged and answered.  Instructed patient to call with any further questions or concerns.  Verlin FesterAmber Yopp, PharmD, [facility]ACP Rheumatology Clinical Pharmacist  04/11/2018 12:07 PM

## 2018-04-11 NOTE — Telephone Encounter (Signed)
Per test claim, patient should be using [facility] pharmacy. [facility]. Per scanned documents, patient's prior authorization should be good until 08/2018.  Provided patient with phone number to [facility].   Please send in prescription to [facility].  12:17 PM Dorthula Nettlesachael N Nakoa Ganus, CPhT

## 2018-04-11 NOTE — Patient Instructions (Signed)
Standing Labs We placed an order today for your standing lab work.    Please come back and get your standing labs in February and every 3 months  We have open lab Monday through Friday from 8:30-11:30 AM and 1:30-4:00 PM  at the office of Dr. Shaili Deveshwar.   You may experience shorter wait times on Monday and Friday afternoons. The office is located at [address], [address], [place], [place] [address] No appointment is necessary.   Labs are drawn by [facility].  You may receive a bill from [facility] for your lab work. If you have any questions regarding directions or hours of operation,  please call [phone].   Just as a reminder please drink plenty of water prior to coming for your lab work. Thanks!  

## 2018-04-12 NOTE — Progress Notes (Signed)
Glucose is 110. Rest of CMP and CBC are WNL.

## 2018-04-13 ENCOUNTER — Encounter: Payer: Self-pay | Admitting: *Deleted

## 2018-04-13 LAB — QUANTIFERON-TB GOLD PLUS
NIL: 0.05 IU/mL
QuantiFERON-TB Gold Plus: NEGATIVE
TB1-NIL: 0.05 IU/mL
TB2-NIL: 0.11 [IU]/mL

## 2018-04-13 LAB — CBC WITH DIFFERENTIAL/PLATELET
Basophils Absolute: 38 cells/uL (ref 0–200)
Basophils Relative: 0.5 %
Eosinophils Absolute: 312 cells/uL (ref 15–500)
Eosinophils Relative: 4.1 %
HCT: 42.7 % (ref 38.5–50.0)
HEMOGLOBIN: 14.8 g/dL (ref 13.2–17.1)
Lymphs Abs: 1991 cells/uL (ref 850–3900)
MCH: 28.4 pg (ref 27.0–33.0)
MCHC: 34.7 g/dL (ref 32.0–36.0)
MCV: 81.8 fL (ref 80.0–100.0)
MONOS PCT: 7.2 %
MPV: 10.6 fL (ref 7.5–12.5)
NEUTROS ABS: 4712 {cells}/uL (ref 1500–7800)
Neutrophils Relative %: 62 %
Platelets: 241 10*3/uL (ref 140–400)
RBC: 5.22 10*6/uL (ref 4.20–5.80)
RDW: 12.8 % (ref 11.0–15.0)
Total Lymphocyte: 26.2 %
WBC mixed population: 547 cells/uL (ref 200–950)
WBC: 7.6 10*3/uL (ref 3.8–10.8)

## 2018-04-13 LAB — COMPLETE METABOLIC PANEL WITH GFR
AG RATIO: 1.2 (calc) (ref 1.0–2.5)
ALT: 18 U/L (ref 9–46)
AST: 21 U/L (ref 10–40)
Albumin: 4.3 g/dL (ref 3.6–5.1)
Alkaline phosphatase (APISO): 96 U/L (ref 40–115)
BILIRUBIN TOTAL: 0.5 mg/dL (ref 0.2–1.2)
BUN: 14 mg/dL (ref 7–25)
CALCIUM: 9.5 mg/dL (ref 8.6–10.3)
CHLORIDE: 104 mmol/L (ref 98–110)
CO2: 29 mmol/L (ref 20–32)
Creat: 0.97 mg/dL (ref 0.60–1.35)
GFR, EST NON AFRICAN AMERICAN: 92 mL/min/{1.73_m2} (ref >=60)
GFR, Est African American: 107 mL/min/{1.73_m2} (ref >=60)
Globulin: 3.5 g/dL (calc) (ref 1.9–3.7)
Glucose, Bld: 110 mg/dL — ABNORMAL HIGH (ref 65–99)
POTASSIUM: 4.6 mmol/L (ref 3.5–5.3)
Sodium: 138 mmol/L (ref 135–146)
Total Protein: 7.8 g/dL (ref 6.1–8.1)

## 2018-04-13 NOTE — Progress Notes (Signed)
TB gold negative

## 2018-04-20 NOTE — Telephone Encounter (Signed)
Called [facility], they have not been able to get in touch with patient to schedule shipment. They have been leaving voicemails. Called patient with the language line and his voicemail box is full.

## 2018-06-14 ENCOUNTER — Other Ambulatory Visit: Payer: Self-pay | Admitting: Rheumatology

## 2018-06-14 NOTE — Telephone Encounter (Signed)
Last Visit: 04/11/18 Next Visit: 09/14/18 Labs: 04/11/18 Glucose is 110. Rest of CMP and CBC are WNL. TB Gold: 04/11/18 Neg   Okay to refill per Dr. Corliss Skains

## 2018-08-30 ENCOUNTER — Other Ambulatory Visit: Payer: Self-pay | Admitting: Rheumatology

## 2018-09-14 ENCOUNTER — Ambulatory Visit: Payer: 59 | Admitting: Rheumatology

## 2018-10-04 ENCOUNTER — Telehealth: Payer: Self-pay | Admitting: Rheumatology

## 2018-10-04 NOTE — Telephone Encounter (Signed)
Patient requesting a refill on Humira sent to [facility].

## 2018-10-05 ENCOUNTER — Encounter: Payer: Self-pay | Admitting: *Deleted

## 2018-10-05 ENCOUNTER — Telehealth: Payer: Self-pay | Admitting: Rheumatology

## 2018-10-05 DIAGNOSIS — Z79899 Other long term (current) drug therapy: Secondary | ICD-10-CM

## 2018-10-05 NOTE — Telephone Encounter (Signed)
Patient going to [facility] in [place] for labs. Please release orders.

## 2018-10-05 NOTE — Telephone Encounter (Signed)
Lab Orders released.  

## 2018-10-05 NOTE — Telephone Encounter (Signed)
Last Visit: 04/11/18 Next Visit: 11/02/18 Labs: 04/11/18 Glucose is 110. Rest of CMP and CBC are WNL. TB Gold: 04/11/18 Neg   Attempted to contact the patient and left message for patient to call the office.

## 2018-10-11 ENCOUNTER — Encounter: Payer: Self-pay | Admitting: Rheumatology

## 2018-10-11 ENCOUNTER — Telehealth: Payer: Self-pay | Admitting: Rheumatology

## 2018-10-11 NOTE — Telephone Encounter (Signed)
Patient called requesting labwork orders be sent to [facility] in [place].  Fax #[phone]

## 2018-10-11 NOTE — Telephone Encounter (Signed)
Lab Orders faxed

## 2018-10-18 ENCOUNTER — Telehealth: Payer: Self-pay | Admitting: *Deleted

## 2018-10-18 MED ORDER — HUMIRA (2 PEN) 40 MG/0.4ML ~~LOC~~ AJKT: 40.0000 mg | AUTO-INJECTOR | SUBCUTANEOUS | 1 refills | Status: DC

## 2018-10-18 NOTE — Telephone Encounter (Signed)
Last Visit: 04/11/18 Next Visit:11/02/18 Labs: 10/11/18 Glucose 141 ALT 44  TB Gold: 04/11/18 Neg  Okay to refill per Dr. Corliss Skains

## 2018-10-18 NOTE — Telephone Encounter (Signed)
Patient requesting refill of Humira to [facility]. Patient's contact # [phone].

## 2018-10-18 NOTE — Telephone Encounter (Signed)
Labs on 10/11/18 Patient advised liver function elevated. Patient advised to avoid all NSAIDS and alcohol. Patient verbalize understanding.

## 2018-10-20 NOTE — Progress Notes (Deleted)
   Office Visit Note  Patient: Jeffrey Herman             Date of Birth: 01/02/1971           MRN: [id number]             PCP: Patient, No Pcp Per Referring: No ref. provider found Visit Date: 11/02/2018 Occupation: @GUAROCC @  Subjective:  No chief complaint on file.   History of Present Illness: Thimothy Reicks is a 48 y.o. male ***   Activities of Daily Living:  Patient reports morning stiffness for *** {minute/hour:19697}.   Patient {ACTIONS;DENIES/REPORTS:21021675::"Denies"} nocturnal pain.  Difficulty dressing/grooming: {ACTIONS;DENIES/REPORTS:21021675::"Denies"} Difficulty climbing stairs: {ACTIONS;DENIES/REPORTS:21021675::"Denies"} Difficulty getting out of chair: {ACTIONS;DENIES/REPORTS:21021675::"Denies"} Difficulty using hands for taps, buttons, cutlery, and/or writing: {ACTIONS;DENIES/REPORTS:21021675::"Denies"}  No Rheumatology ROS completed.   PMFS History:  Patient Active Problem List   Diagnosis Date Noted  . High risk medication use 08/12/2016  . 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 Contracture of elbow joint, right 08/12/2016  . Primary osteoarthritis of both hands 08/12/2016  . Rheumatoid arthritis (HCC) 12/22/2010  . Depression 12/22/2010  . Elevated C-reactive protein (CRP) 12/16/2010  . Elevated sedimentation rate 12/11/2010  . Arthralgia of multiple sites 12/11/2010    No past medical history on file.  No family history on file. No past surgical history on file. Social History   Social History Narrative  . Not on file    There is no immunization history on file for this patient.   Objective: Vital Signs: There were no vitals taken for this visit.   Physical Exam   Musculoskeletal Exam: ***  CDAI Exam: CDAI Score: Not documented Patient Global Assessment: Not documented; Provider Global Assessment: Not documented Swollen: Not documented; Tender: Not documented Joint Exam   Not documented   There is currently no information  documented on the homunculus. Go to the Rheumatology activity and complete the homunculus joint exam.  Investigation: No additional findings.  Imaging: No results found.  Recent Labs: Lab Results  Component Value Date   WBC 7.6 04/11/2018   HGB 14.8 04/11/2018   PLT 241 04/11/2018   NA 138 04/11/2018   K 4.6 04/11/2018   CL 104 04/11/2018   CO2 29 04/11/2018   GLUCOSE 110 (H) 04/11/2018   BUN 14 04/11/2018   CREATININE 0.97 04/11/2018   BILITOT 0.5 04/11/2018   ALKPHOS 72 08/12/2016   AST 21 04/11/2018   ALT 18 04/11/2018   PROT 7.8 04/11/2018   ALBUMIN 4.1 08/12/2016   CALCIUM 9.5 04/11/2018   GFRAA 107 04/11/2018   QFTBGOLDPLUS NEGATIVE 04/11/2018    Speciality Comments: No specialty comments available.  Procedures:  No procedures performed Allergies: Patient has no known allergies.   Assessment / Plan:     Visit Diagnoses: No diagnosis found.   Orders: No orders of the defined types were placed in this encounter.  No orders of the defined types were placed in this encounter.   Face-to-face time spent with patient was *** minutes. Greater than 50% of time was spent in counseling and coordination of care.  Follow-Up Instructions: No follow-ups on file.   Ellen Henri, CMA  Note - This record has been created using Animal nutritionist.  Chart creation errors have been sought, but may not always  have been located. Such creation errors do not reflect on  the standard of medical care.

## 2018-11-02 ENCOUNTER — Ambulatory Visit: Payer: Self-pay | Admitting: Rheumatology

## 2018-11-03 NOTE — Progress Notes (Signed)
Office Visit Note  Patient: Jeffrey Herman             Date of Birth: 09/30/1970           MRN: [id number]             PCP: Patient, No Pcp Per Referring: No ref. 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     History of Present Illness: Jeffrey Herman is a 48 y.o. male with history of rheumatoid arthritis and psoriasis.  He states he has been doing well on Humira.  He has been taking Humira shots every other week.  He denies any joint pain or joint swelling.  He states he still have few psoriasis patches, for which he uses topical agent.  Activities of Daily Living:  Patient reports morning stiffness for 0 minutes.   Patient Reports nocturnal pain.  Difficulty dressing/grooming: Denies Difficulty climbing stairs: Denies Difficulty getting out of chair: Denies Difficulty using hands for taps, buttons, cutlery, and/or writing: Denies  Review of Systems  Constitutional: Negative for fatigue and night sweats.  HENT: Negative for mouth sores, mouth dryness and nose dryness.   Eyes: Negative for redness, itching and dryness.  Respiratory: Negative for shortness of breath and difficulty breathing.   Cardiovascular: Negative for chest pain, palpitations, hypertension, irregular heartbeat and swelling in legs/feet.  Gastrointestinal: Negative for constipation and diarrhea.  Endocrine: Negative for increased urination.  Genitourinary: Negative for painful urination.  Musculoskeletal: Negative for arthralgias, joint pain, joint swelling, myalgias, muscle weakness, morning stiffness, muscle tenderness and myalgias.  Skin: Positive for rash. Negative for color change, hair loss, nodules/bumps, skin tightness, ulcers and sensitivity to sunlight.       Psoriasis  Allergic/Immunologic: Negative for susceptible to infections.  Neurological: Negative for dizziness, fainting, light-headedness, headaches, memory loss,  night sweats and weakness.  Hematological: Negative for swollen glands.  Psychiatric/Behavioral: Negative for depressed mood, confusion and sleep disturbance. The patient is not nervous/anxious.     PMFS History:  Patient Active Problem List   Diagnosis Date Noted  . High risk medication use 08/12/2016  . Primary osteoarthritis of both hands 08/12/2016  . Rheumatoid arthritis (HCC) 12/22/2010  . Depression 12/22/2010  . Elevated C-reactive protein (CRP) 12/16/2010  . Elevated sedimentation rate 12/11/2010  . Arthralgia of multiple sites 12/11/2010    History reviewed. No pertinent past medical history.  History reviewed. No pertinent family history. History reviewed. No pertinent surgical history. Social History   Social History Narrative  . Not on file    There is no immunization history on file for this patient.   Objective: Vital Signs: BP 129/82 (BP Location: Right Arm, Patient Position: Sitting, Cuff Size: Normal)   Pulse 81   Resp 14   Ht 5\' 5"  (1.651 m)   Wt 148 lb 9.6 oz (67.4 kg)   BMI 24.73 kg/m    Physical Exam Vitals signs and nursing note reviewed.  Constitutional:      Appearance: He is well-developed.  HENT:     Head: Normocephalic and atraumatic.  Eyes:     Conjunctiva/sclera: Conjunctivae normal.     Pupils: Pupils are equal, round, and reactive to light.  Neck:     Musculoskeletal: Normal range of motion and neck supple.  Cardiovascular:     Rate and Rhythm: Normal rate and regular rhythm.     Heart sounds: Normal heart sounds.  Pulmonary:     Effort: Pulmonary effort is normal.  Breath sounds: Normal breath sounds.  Abdominal:     General: Bowel sounds are normal.     Palpations: Abdomen is soft.  Skin:    General: Skin is warm and dry.     Capillary Refill: Capillary refill takes less than 2 seconds.     Findings: Rash present.     Comments: Psoriasis patch on the back and scalp.  Neurological:     Mental Status: He is alert and  oriented to person, place, and time.  Psychiatric:        Behavior: Behavior normal.      Musculoskeletal Exam: C-spine thoracic and lumbar spine good range of motion.  Shoulder joints elbow joints wrist joint MCPs PIPs DIPs with good range of motion with no synovitis.  He has some DIP and PIP thickening in his hands.  Hip joints knee joints ankles MTPs PIPs been good range of motion with no synovitis.  CDAI Exam: CDAI Score: 0.2  Patient Global: 1 mm; Provider Global: 1 mm Swollen: 0 ; Tender: 0  Joint Exam   No joint exam has been documented for this visit   There is currently no information documented on the homunculus. Go to the Rheumatology activity and complete the homunculus joint exam.  Investigation: No additional findings.  Imaging: No results found.  Recent Labs: Lab Results  Component Value Date   WBC 7.6 04/11/2018   HGB 14.8 04/11/2018   PLT 241 04/11/2018   NA 138 04/11/2018   K 4.6 04/11/2018   CL 104 04/11/2018   CO2 29 04/11/2018   GLUCOSE 110 (H) 04/11/2018   BUN 14 04/11/2018   CREATININE 0.97 04/11/2018   BILITOT 0.5 04/11/2018   ALKPHOS 72 08/12/2016   AST 21 04/11/2018   ALT 18 04/11/2018   PROT 7.8 04/11/2018   ALBUMIN 4.1 08/12/2016   CALCIUM 9.5 04/11/2018   GFRAA 107 04/11/2018   QFTBGOLDPLUS NEGATIVE 04/11/2018    Speciality Comments: No specialty comments available.  Procedures:  No procedures performed Allergies: Patient has no known allergies.   Assessment / Plan:     Visit Diagnoses: Rheumatoid arthritis involving multiple sites with positive rheumatoid factor (HCC) - RF positive, CCP negative, ANA negative -he is doing very well with no synovitis on examination.  Plan: Continue current treatment.  High risk medication use -  Humira 40 mg every 14 days.  Last TB gold negative on 04/11/2018 and will monitor yearly.  Most recent CBC/CMP within normal limits on 04/11/2018.  He had labs in May 2020.  LFTs were mildly elevated.   Avoidance of NSAIDs was discussed.  We  will monitor every 3 months.  Standing orders are in place.  Recommend annual flu, Prevnar 13, Pneumovax 23 vaccines as indicated.  Recommend yearly skin exams.  Primary osteoarthritis of both hands - Plan: Joint protection muscle strengthening was discussed.  Psoriasis - Plan: Use of topical steroid and sun exposure was discussed.  Language barrier-patient had a interpreter present today.  History of depression   Association of heart disease with rheumatoid arthritis was discussed. Need to monitor blood pressure, cholesterol, and to exercise 30-60 minutes on daily basis was discussed. Poor dental hygiene can be a predisposing factor for rheumatoid arthritis. Good dental hygiene was discussed.  Orders: No orders of the defined types were placed in this encounter.  No orders of the defined types were placed in this encounter.  Face-to-face time spent patient was 25 minutes.  Greater than 50% time was spent in counseling  and coordination of care.  Follow-Up Instructions: Return in about 5 months (around 04/11/2019) for Rheumatoid arthritis.   Bo Merino, MD  Note - This record has been created using Editor, commissioning.  Chart creation errors have been sought, but may not always  have been located. Such creation errors do not reflect on  the standard of medical care.

## 2018-11-09 ENCOUNTER — Ambulatory Visit (INDEPENDENT_AMBULATORY_CARE_PROVIDER_SITE_OTHER): Payer: Managed Care, Other (non HMO) | Admitting: Rheumatology

## 2018-11-09 ENCOUNTER — Encounter: Payer: Self-pay | Admitting: Rheumatology

## 2018-11-09 VITALS — BP 129/82 | HR 81 | Resp 14 | Ht 65.0 in | Wt 148.6 lb

## 2018-11-09 DIAGNOSIS — M0579 Rheumatoid arthritis with rheumatoid factor of multiple sites without organ or systems involvement: Secondary | ICD-10-CM

## 2018-11-09 DIAGNOSIS — Z789 Other specified health status: Secondary | ICD-10-CM

## 2018-11-09 DIAGNOSIS — L409 Psoriasis, unspecified: Secondary | ICD-10-CM

## 2018-11-09 DIAGNOSIS — Z8659 Personal history of other mental and behavioral disorders: Secondary | ICD-10-CM

## 2018-11-09 DIAGNOSIS — M19041 Primary osteoarthritis, right hand: Secondary | ICD-10-CM | POA: Diagnosis not present

## 2018-11-09 DIAGNOSIS — M19042 Primary osteoarthritis, left hand: Secondary | ICD-10-CM

## 2018-11-09 DIAGNOSIS — Z79899 Other long term (current) drug therapy: Secondary | ICD-10-CM

## 2018-11-09 NOTE — Patient Instructions (Signed)
Standing Labs We placed an order today for your standing lab work.    Please come back and get your standing labs in August and every 3 months  We have open lab daily Monday through Thursday from 8:30-12:30 PM and 1:30-4:30 PM and Friday from 8:30-12:30 PM and 1:30 -4:00 PM at the office of Dr. Nevea Spiewak.   You may experience shorter wait times on Monday and Friday afternoons. The office is located at [address], [address], [place], [place] [address] No appointment is necessary.   Labs are drawn by [facility].  You may receive a bill from [facility] for your lab work.  If you wish to have your labs drawn at another location, please call the office 24 hours in advance to send orders.  If you have any questions regarding directions or hours of operation,  please call [phone].   Just as a reminder please drink plenty of water prior to coming for your lab work. Thanks!   

## 2018-12-02 ENCOUNTER — Telehealth: Payer: Self-pay | Admitting: Rheumatology

## 2018-12-02 NOTE — Telephone Encounter (Signed)
[facility] calling because patients prior authorization for Humira is about to expire. Please call with new prior auth.

## 2018-12-05 NOTE — Telephone Encounter (Signed)
Received a Prior Authorization request from [place] for [place]. Authorization has been submitted to patient's insurance via Cover My Meds.   Drug is covered by current benefit plan. No further PA activity needed This PA cannot be submitted.

## 2018-12-06 NOTE — Telephone Encounter (Signed)
Received prior authorization request from [place] for Humira.  Filled out request form and faxed prior authorization request. Will send document to the scan center.  Will update when we receive a response.  Fax # [phone] Phone # [phone]

## 2018-12-08 NOTE — Telephone Encounter (Signed)
Received call from [place] stating that Humira still needs a prior authorization.  They called [employer] and was told that [employer] was not handling the prior authorization that Christella Scheuermann was.  Previously submitted prior authorization through cover my meds for Humira for [employer] and response was that a prior authorization was not needed as stroke was already covered.  Submitted through cover my meds for Humira through [employer] and received same response.  We will follow-up with insurance to see what is needed to update prior authorization or gain approval for Humira.  Current prior authorization ends on July 30.

## 2018-12-09 NOTE — Telephone Encounter (Signed)
Received a Prior Authorization request from [place] for [facility]. Authorization has been submitted to patient's insurance via Phone. Will update once we receive a response.  Ref# [id number] [place]  Phone# [phone]  10:56 AM Beatriz Chancellor, CPhT

## 2018-12-12 NOTE — Telephone Encounter (Signed)
Received a fax from [employer] regarding a prior authorization for [facility]. Authorization has been APPROVED from 12/09/2018 to 12/09/2019.   Will send document to scan center.  Authorization # [id number]   Mariella Saa, PharmD, [place], CPP Clinical Specialty Pharmacist [phone]  12/12/2018 8:28 AM

## 2018-12-30 ENCOUNTER — Encounter: Payer: Self-pay | Admitting: Rheumatology

## 2019-01-20 ENCOUNTER — Telehealth: Payer: Self-pay | Admitting: Rheumatology

## 2019-01-20 NOTE — Telephone Encounter (Signed)
Patient left a voicemail stating he was returning a call to the office.   °

## 2019-01-26 NOTE — Telephone Encounter (Signed)
Attempted to contact the patient and left message for patient to call the office.  

## 2019-03-16 ENCOUNTER — Other Ambulatory Visit: Payer: Self-pay | Admitting: Rheumatology

## 2019-03-16 NOTE — Telephone Encounter (Signed)
Last Visit: 11/09/18 Next Visit: 04/18/19  Labs: 12/30/18 glucose 121 all other lab WNL TB Gold: 04/12/19 Neg   Okay to refill per Dr. Estanislado Pandy

## 2019-04-04 NOTE — Progress Notes (Deleted)
   Office Visit Note  Patient: Jeffrey Herman             Date of Birth: 1970/12/24           MRN: [id number]             PCP: Patient, No Pcp Per Referring: No ref. provider found Visit Date: 04/18/2019 Occupation: @GUAROCC @  Subjective:  No chief complaint on file.   History of Present Illness: Jeffrey Herman is a 48 y.o. male ***   Activities of Daily Living:  Patient reports morning stiffness for *** {minute/hour:19697}.   Patient {ACTIONS;DENIES/REPORTS:21021675::"Denies"} nocturnal pain.  Difficulty dressing/grooming: {ACTIONS;DENIES/REPORTS:21021675::"Denies"} Difficulty climbing stairs: {ACTIONS;DENIES/REPORTS:21021675::"Denies"} Difficulty getting out of chair: {ACTIONS;DENIES/REPORTS:21021675::"Denies"} Difficulty using hands for taps, buttons, cutlery, and/or writing: {ACTIONS;DENIES/REPORTS:21021675::"Denies"}  No Rheumatology ROS completed.   PMFS History:  Patient Active Problem List   Diagnosis Date Noted  . High risk medication use 08/12/2016  . Primary osteoarthritis of both hands 08/12/2016  . Rheumatoid arthritis ([place]) 12/22/2010  . Depression 12/22/2010  . Elevated C-reactive protein (CRP) 12/16/2010  . Elevated sedimentation rate 12/11/2010  . Arthralgia of multiple sites 12/11/2010    No past medical history on file.  No family history on file. No past surgical history on file. Social History   Social History Narrative  . Not on file    There is no immunization history on file for this patient.   Objective: Vital Signs: There were no vitals taken for this visit.   Physical Exam   Musculoskeletal Exam: ***  CDAI Exam: CDAI Score: - Patient Global: -; Provider Global: - Swollen: -; Tender: - Joint Exam   No joint exam has been documented for this visit   There is currently no information documented on the homunculus. Go to the Rheumatology activity and complete the homunculus joint exam.  Investigation: No additional findings.   Imaging: No results found.  Recent Labs: Lab Results  Component Value Date   WBC 7.6 04/11/2018   HGB 14.8 04/11/2018   PLT 241 04/11/2018   NA 138 04/11/2018   K 4.6 04/11/2018   CL 104 04/11/2018   CO2 29 04/11/2018   GLUCOSE 110 (H) 04/11/2018   BUN 14 04/11/2018   CREATININE 0.97 04/11/2018   BILITOT 0.5 04/11/2018   ALKPHOS 72 08/12/2016   AST 21 04/11/2018   ALT 18 04/11/2018   PROT 7.8 04/11/2018   ALBUMIN 4.1 08/12/2016   CALCIUM 9.5 04/11/2018   GFRAA 107 04/11/2018   QFTBGOLDPLUS NEGATIVE 04/11/2018    Speciality Comments: Prior therapy: Sulfasalazine (elevated LFT's) and MTX (inadequate response)  Procedures:  No procedures performed Allergies: Patient has no known allergies.   Assessment / Plan:     Visit Diagnoses: No diagnosis found.  Orders: No orders of the defined types were placed in this encounter.  No orders of the defined types were placed in this encounter.   Face-to-face time spent with patient was *** minutes. Greater than 50% of time was spent in counseling and coordination of care.  Follow-Up Instructions: No follow-ups on file.   Earnestine Mealing, CMA  Note - This record has been created using Editor, commissioning.  Chart creation errors have been sought, but may not always  have been located. Such creation errors do not reflect on  the standard of medical care.

## 2019-04-18 ENCOUNTER — Ambulatory Visit: Payer: Managed Care, Other (non HMO) | Admitting: Rheumatology

## 2019-06-08 NOTE — Progress Notes (Deleted)
   Office Visit Note  Patient: Jeffrey Herman             Date of Birth: 10/13/1970           MRN: [id number]             PCP: Patient, No Pcp Per Referring: No ref. provider found Visit Date: 06/15/2019 Occupation: @GUAROCC @  Subjective:  No chief complaint on file.   History of Present Illness: [name] is a 49 y.o. male ***   Activities of Daily Living:  Patient reports morning stiffness for *** {minute/hour:19697}.   Patient {ACTIONS;DENIES/REPORTS:21021675::"Denies"} nocturnal pain.  Difficulty dressing/grooming: {ACTIONS;DENIES/REPORTS:21021675::"Denies"} Difficulty climbing stairs: {ACTIONS;DENIES/REPORTS:21021675::"Denies"} Difficulty getting out of chair: {ACTIONS;DENIES/REPORTS:21021675::"Denies"} Difficulty using hands for taps, buttons, cutlery, and/or writing: {ACTIONS;DENIES/REPORTS:21021675::"Denies"}  No Rheumatology ROS completed.   PMFS History:  Patient Active Problem List   Diagnosis Date Noted  . High risk medication use 08/12/2016  . Primary osteoarthritis of both hands 08/12/2016  . Rheumatoid arthritis (HCC) 12/22/2010  . Depression 12/22/2010  . Elevated C-reactive protein (CRP) 12/16/2010  . Elevated sedimentation rate 12/11/2010  . Arthralgia of multiple sites 12/11/2010    No past medical history on file.  No family history on file. No past surgical history on file. Social History   Social History Narrative  . Not on file    There is no immunization history on file for this patient.   Objective: Vital Signs: There were no vitals taken for this visit.   Physical Exam   Musculoskeletal Exam: ***  CDAI Exam: CDAI Score: -- Patient Global: --; Provider Global: -- Swollen: --; Tender: -- Joint Exam 06/15/2019   No joint exam has been documented for this visit   There is currently no information documented on the homunculus. Go to the Rheumatology activity and complete the homunculus joint exam.  Investigation: No additional  findings.  Imaging: No results found.  Recent Labs: Lab Results  Component Value Date   WBC 7.6 04/11/2018   HGB 14.8 04/11/2018   PLT 241 04/11/2018   NA 138 04/11/2018   K 4.6 04/11/2018   CL 104 04/11/2018   CO2 29 04/11/2018   GLUCOSE 110 (H) 04/11/2018   BUN 14 04/11/2018   CREATININE 0.97 04/11/2018   BILITOT 0.5 04/11/2018   ALKPHOS 72 08/12/2016   AST 21 04/11/2018   ALT 18 04/11/2018   PROT 7.8 04/11/2018   ALBUMIN 4.1 08/12/2016   CALCIUM 9.5 04/11/2018   GFRAA 107 04/11/2018   QFTBGOLDPLUS NEGATIVE 04/11/2018    Speciality Comments: Prior therapy: Sulfasalazine (elevated LFT's) and MTX (inadequate response)  Procedures:  No procedures performed Allergies: Patient has no known allergies.   Assessment / Plan:     Visit Diagnoses: No diagnosis found.  Orders: No orders of the defined types were placed in this encounter.  No orders of the defined types were placed in this encounter.   Face-to-face time spent with patient was *** minutes. Greater than 50% of time was spent in counseling and coordination of care.  Follow-Up Instructions: No follow-ups on file.   04/13/2018, CMA  Note - This record has been created using Ellen Henri.  Chart creation errors have been sought, but may not always  have been located. Such creation errors do not reflect on  the standard of medical care.

## 2019-06-13 NOTE — Progress Notes (Deleted)
Virtual Visit via Telephone Note  I connected with Jeffrey Herman on 06/13/19 at 10:30 AM EST by telephone and verified that I am speaking with the correct person using two identifiers.  Location: Patient: Home Provider: Clinic This service was conducted via virtual visit. The patient was located at home. I was located in my office.  Consent was obtained prior to the virtual visit and is aware of possible charges through their insurance for this visit.  The patient is an established patient.  Dr. Corliss Skains, MD conducted the virtual visit and Sherron Ales, PA-C acted as scribe during the service.  Office staff helped with scheduling follow up visits after the service was conducted.     I discussed the limitations, risks, security and privacy concerns of performing an evaluation and management service by telephone and the availability of in person appointments. I also discussed with the patient that there may be a patient responsible charge related to this service. The patient expressed understanding and agreed to proceed.  CC: History of Present Illness: Patient is a 49 year old male with a past medical history of   Review of Systems  Constitutional: Negative for fever and malaise/fatigue.  Eyes: Negative for photophobia, pain, discharge and redness.  Respiratory: Negative for cough, shortness of breath and wheezing.   Cardiovascular: Negative for chest pain and palpitations.  Gastrointestinal: Negative for blood in stool, constipation and diarrhea.  Genitourinary: Negative for dysuria.  Musculoskeletal: Negative for back pain, joint pain, myalgias and neck pain.  Skin: Negative for rash.  Neurological: Negative for dizziness and headaches.  Psychiatric/Behavioral: Negative for depression. The patient is not nervous/anxious and does not have insomnia.       Observations/Objective: Physical Exam  Constitutional: He is oriented to person, place, and time.  Neurological: He is alert and  oriented to person, place, and time.  Psychiatric: Mood, memory, affect and judgment normal.   Patient reports morning stiffness for *** {minute/hour:19697}.   Patient {Actions; denies-reports:120008} nocturnal pain.  Difficulty dressing/grooming: {ACTIONS;DENIES/REPORTS:21021675::"Denies"} Difficulty climbing stairs: {ACTIONS;DENIES/REPORTS:21021675::"Denies"} Difficulty getting out of chair: {ACTIONS;DENIES/REPORTS:21021675::"Denies"} Difficulty using hands for taps, buttons, cutlery, and/or writing: {ACTIONS;DENIES/REPORTS:21021675::"Denies"}   Assessment and Plan: Visit Diagnoses: Rheumatoid arthritis involving multiple sites with positive rheumatoid factor (HCC) - RF positive, CCP negative, ANA negative -he is doing very well with no synovitis on examination.  Plan: Continue current treatment.  High risk medication use - Humira 40 mg every 14 days.  Last TB gold negative on 04/11/2018 and will monitor yearly.  Primary osteoarthritis of both hands - Plan: Joint protection muscle strengthening was discussed.  Psoriasis - Plan: Use of topical steroid and sun exposure was discussed.  Language barrier-patient had a interpreter present today.  History of depression   Follow Up Instructions: He will follow up in    I discussed the assessment and treatment plan with the patient. The patient was provided an opportunity to ask questions and all were answered. The patient agreed with the plan and demonstrated an understanding of the instructions.   The patient was advised to call back or seek an in-person evaluation if the symptoms worsen or if the condition fails to improve as anticipated.  I provided *** minutes of non-face-to-face time during this encounter.   Gearldine Bienenstock, PA-C

## 2019-06-15 ENCOUNTER — Ambulatory Visit: Payer: Managed Care, Other (non HMO) | Admitting: Rheumatology

## 2019-06-21 ENCOUNTER — Other Ambulatory Visit: Payer: Self-pay

## 2019-06-21 ENCOUNTER — Telehealth: Payer: Managed Care, Other (non HMO) | Admitting: Rheumatology

## 2019-06-26 NOTE — Progress Notes (Signed)
Virtual Visit via Telephone Note  I connected with Forrest Luczynski on 06/28/19 at  4:00 PM EST by telephone and verified that I am speaking with the correct person using two identifiers.  Location: Patient: Home Provider: Clinic  This service was conducted via virtual visit.   The patient was located at home. I was located in my office.  Consent was obtained prior to the virtual visit and is aware of possible charges through their insurance for this visit.  The patient is an established patient.  Dr. Corliss Skains, MD conducted the virtual visit and Sherron Ales, PA-C acted as scribe during the service.  Office staff helped with scheduling follow up visits after the service was conducted.     I discussed the limitations, risks, security and privacy concerns of performing an evaluation and management service by telephone and the availability of in person appointments. I also discussed with the patient that there may be a patient responsible charge related to this service. The patient expressed understanding and agreed to proceed.  Gisel Mean-step-daughter acted as Nurse, learning disability   CC: medication monitoring  History of Present Illness: Patient is a 49 year old male with a past medical history of seropositive rheumatoid arthritis and osteoarthritis. He is on Humira 40 mg sq injections every 21 days. He has not had any recent flares.  He denies any joint pain or joint swelling at this time.  He denies any morning stiffness.  He denies any eye inflammation.  He has psoriasis on his scalp but it is mild at this time.     Review of Systems  Constitutional: Negative for fever and malaise/fatigue.  HENT: Negative for congestion.   Eyes: Negative for photophobia, pain, discharge and redness.  Respiratory: Negative for cough, shortness of breath and wheezing.   Cardiovascular: Negative for chest pain, palpitations and leg swelling.  Gastrointestinal: Negative for blood in stool, constipation and diarrhea.   Genitourinary: Negative for dysuria and frequency.  Musculoskeletal: Negative for back pain, joint pain, myalgias and neck pain.  Skin: Negative for rash.  Neurological: Negative for dizziness, weakness and headaches.  Endo/Heme/Allergies: Does not bruise/bleed easily.  Psychiatric/Behavioral: Negative for depression and memory loss. The patient is not nervous/anxious and does not have insomnia.       Observations/Objective: Physical Exam  Constitutional: He is oriented to person, place, and time.  Neurological: He is alert and oriented to person, place, and time.  Psychiatric: Mood, memory, affect and judgment normal.   Patient reports morning stiffness for 0 none.   Patient denies nocturnal pain.  Difficulty dressing/grooming: Denies Difficulty climbing stairs: Denies Difficulty getting out of chair: Denies Difficulty using hands for taps, buttons, cutlery, and/or writing: Denies   Assessment and Plan: Visit Diagnoses: Rheumatoid arthritis involving multiple sites with positive rheumatoid factor (HCC) - RF positive, CCP negative, ANA negative -He has not had any recent rheumatoid arthritis flares. He has no joint pain or joint swelling at this time.  He has no morning stiffness.  He is clinically doing well on Humira 40 mg sq injections every 21 days.  He has not had any increased symptoms since spacing the dose of Humira.  He has no concerns at this time.  He is overdue to update CBC, CMP, and TB gold. We explained the risks of not having routine lab monitoring while on humira. Orders placed today.  He will continue on Humira 40 mg sq injections every 21 days.  He was advised to notify us if he develops increased joint  pain or joint swelling.  He will follow up in 3-4 months.    High risk medication use - Humira 40 mg sq injections every 14 days. CBC and CMP were drawn on 12/30/18.  He is overdue to update lab work.  standing orders were placed today.  TB gold negative on 04/11/18.   Future order for TB gold placed today.   Primary osteoarthritis of both hands -He is not having any hand pain or joint inflammation at this time.  He has no difficulty with ADLs.    Psoriasis - He has mild psoriasis on his scalp currently.  He will continue on Humira 40 mg sq injections every 21 days.   Contracture of elbow joint, right  Contracture of elbow joint, left   Language barrier-Patient's daughter in law Namon Cirri acted as Optometrist.    History of depression   Follow Up Instructions: He will follow up in 3-4 months.    I discussed the assessment and treatment plan with the patient. The patient was provided an opportunity to ask questions and all were answered. The patient agreed with the plan and demonstrated an understanding of the instructions.   The patient was advised to call back or seek an in-person evaluation if the symptoms worsen or if the condition fails to improve as anticipated.  I provided 20 minutes of non-face-to-face time during this encounter.   Bo Merino, MD   Scribed by-  Hazel Sams, PA-C

## 2019-06-28 ENCOUNTER — Encounter: Payer: Self-pay | Admitting: Rheumatology

## 2019-06-28 ENCOUNTER — Other Ambulatory Visit: Payer: Self-pay

## 2019-06-28 ENCOUNTER — Telehealth (INDEPENDENT_AMBULATORY_CARE_PROVIDER_SITE_OTHER): Payer: Managed Care, Other (non HMO) | Admitting: Rheumatology

## 2019-06-28 DIAGNOSIS — L409 Psoriasis, unspecified: Secondary | ICD-10-CM

## 2019-06-28 DIAGNOSIS — Z79899 Other long term (current) drug therapy: Secondary | ICD-10-CM

## 2019-06-28 DIAGNOSIS — M24522 Contracture, left elbow: Secondary | ICD-10-CM

## 2019-06-28 DIAGNOSIS — M24521 Contracture, right elbow: Secondary | ICD-10-CM

## 2019-06-28 DIAGNOSIS — M19042 Primary osteoarthritis, left hand: Secondary | ICD-10-CM

## 2019-06-28 DIAGNOSIS — M19041 Primary osteoarthritis, right hand: Secondary | ICD-10-CM

## 2019-06-28 DIAGNOSIS — Z8659 Personal history of other mental and behavioral disorders: Secondary | ICD-10-CM

## 2019-06-28 DIAGNOSIS — M0579 Rheumatoid arthritis with rheumatoid factor of multiple sites without organ or systems involvement: Secondary | ICD-10-CM | POA: Diagnosis not present

## 2019-06-28 DIAGNOSIS — Z789 Other specified health status: Secondary | ICD-10-CM

## 2019-06-29 ENCOUNTER — Other Ambulatory Visit: Payer: Self-pay | Admitting: Rheumatology

## 2019-06-29 NOTE — Telephone Encounter (Signed)
Ok to refill 30-day supply until he has updated lab work.

## 2019-06-29 NOTE — Telephone Encounter (Signed)
Last Visit: 06/28/19  Next Visit: May/June 2021 Labs: 12/30/18 glucose 121 all other lab WNL TB Gold: 04/12/19 Neg   Patient advised during telemedicine visit yesterday he is due to update labs.  Okay to refill 30 day supply Humira?

## 2019-06-30 ENCOUNTER — Telehealth: Payer: Self-pay | Admitting: Rheumatology

## 2019-06-30 ENCOUNTER — Other Ambulatory Visit: Payer: Self-pay

## 2019-06-30 DIAGNOSIS — Z79899 Other long term (current) drug therapy: Secondary | ICD-10-CM

## 2019-06-30 NOTE — Telephone Encounter (Signed)
Lab orders faxed to number provided

## 2019-06-30 NOTE — Telephone Encounter (Signed)
Patient's daughter request lab orders to be faxed to [facility] in [place]. Fax# [phone] They will draw labs for patient.

## 2019-08-21 ENCOUNTER — Telehealth: Payer: Self-pay | Admitting: Rheumatology

## 2019-08-21 NOTE — Telephone Encounter (Signed)
[facility] left a voicemail requesting a return call regarding patient's prescription.  No other information was left on voicemail.  Please call back at #[phone]

## 2019-08-21 NOTE — Telephone Encounter (Signed)
Returned call. The representative states they have been trying to reach the patient and have been unable to reach the patient. They were asking for a better call back number. Advised representative that we only had the one phone number on file for the patient and advised I would try reaching out to the patient. Contacted patient and spoke with him to advise he needs to contact [facility]. Patient will do so.

## 2019-10-26 ENCOUNTER — Telehealth: Payer: Self-pay | Admitting: Rheumatology

## 2019-10-26 NOTE — Telephone Encounter (Signed)
Patient left a voicemail requesting labwork orders be sent to [facility] in [place].

## 2019-10-27 ENCOUNTER — Other Ambulatory Visit: Payer: Self-pay | Admitting: *Deleted

## 2019-10-27 DIAGNOSIS — Z111 Encounter for screening for respiratory tuberculosis: Secondary | ICD-10-CM

## 2019-10-27 DIAGNOSIS — Z9225 Personal history of immunosupression therapy: Secondary | ICD-10-CM

## 2019-10-27 DIAGNOSIS — Z79899 Other long term (current) drug therapy: Secondary | ICD-10-CM

## 2019-10-27 NOTE — Telephone Encounter (Signed)
Lab Orders faxed

## 2019-11-03 ENCOUNTER — Telehealth: Payer: Self-pay | Admitting: Rheumatology

## 2019-11-03 NOTE — Telephone Encounter (Signed)
Lab Orders faxed

## 2019-11-03 NOTE — Telephone Encounter (Signed)
Patient's daughter requested labwork orders to be faxed to [facility] in [place].  Patient will be going today, 11/03/19.  Fax #[phone]

## 2020-01-05 ENCOUNTER — Telehealth: Payer: Self-pay | Admitting: *Deleted

## 2020-01-05 MED ORDER — HUMIRA (2 PEN) 40 MG/0.4ML ~~LOC~~ AJKT: AUTO-INJECTOR | SUBCUTANEOUS | 0 refills | Status: DC

## 2020-01-05 NOTE — Telephone Encounter (Signed)
Refill request for Humira  Last Visit: 06/28/2019 Next Visit: May/June 2021 Labs: 11/03/2019 calcium 8.1 all other labs WNL TB Gold: 11/03/2019 Neg   Current Dose per office note 06/28/2019: Humira 40 mg sq injections every 21 days  YE:[id number] arthritis involving multiple sites with positive rheumatoid factor   Okay to refill per Dr. Corliss Skains

## 2020-01-05 NOTE — Telephone Encounter (Signed)
Please schedule patient for a follow up visit. Patient was due in June 2021. Thanks!

## 2020-01-08 NOTE — Telephone Encounter (Signed)
LMOM for patient to call and schedule follow-up appointment.   °

## 2020-01-09 ENCOUNTER — Telehealth: Payer: Self-pay | Admitting: *Deleted

## 2020-01-09 NOTE — Telephone Encounter (Signed)
Labs received from [facility] on 11/03/2019 Reviewed by Sherron Ales, PA-C  Calcium 8.1 and all other labs WNL TB Gold: Negative  Recommendations. Please notify patient of low Calcium and recommend vitamin D supplement.

## 2020-01-10 NOTE — Telephone Encounter (Signed)
Attempted to contact the patient and left message for patient to call the office.  

## 2020-01-25 ENCOUNTER — Telehealth: Payer: Self-pay | Admitting: Rheumatology

## 2020-01-25 NOTE — Telephone Encounter (Signed)
Received a voicemail "I'm calling about Jeffrey Herman.  He recently took his medication and has increased swelling.  The medication doesn't seem to be doing anything.  His swelling went down for about 3 days and then it came back and now it hurts on his right side from his leg to his arm."  Please call back at #[phone]

## 2020-01-26 ENCOUNTER — Telehealth: Payer: Self-pay | Admitting: Rheumatology

## 2020-01-26 MED ORDER — HUMIRA (2 PEN) 40 MG/0.4ML ~~LOC~~ AJKT: 40.0000 mg | AUTO-INJECTOR | SUBCUTANEOUS | 0 refills | Status: DC

## 2020-01-26 MED ORDER — PREDNISONE 5 MG PO TABS
ORAL_TABLET | ORAL | 0 refills | Status: DC
Start: 2020-01-26 — End: 2020-05-01

## 2020-01-26 NOTE — Telephone Encounter (Signed)
Patient having a lot of swelling with his fingers on right hand, and right foot/ toes. Right elbow hurting to move it, and pain with lower back.  All this started about 3 weeks ago. Humira injection was taken 02/11/20. It helped for a few days, now very thing is back to the way it was before.

## 2020-01-26 NOTE — Telephone Encounter (Signed)
Per Sherron Ales, PA-C ( reviewed and read back prescription) send in Prednisone starting at 5 mg and taper every 2 days. # 20 and no refills.  Patient scheduled for a follow up appointment 01/31/2020. Patient will update labs at that appointment. Patient advised to increase frequency of Humira to every 14 days.

## 2020-01-26 NOTE — Telephone Encounter (Signed)
Attempted to contact the patient and left message for patient to call the office.  

## 2020-01-30 NOTE — Progress Notes (Signed)
Office Visit Note  Patient: Jeffrey Herman             Date of Birth: 10/10/1970           MRN: [id number]             PCP: Patient, No Pcp Per Referring: No ref. provider found Visit Date: 01/31/2020 Occupation: @GUAROCC @  Subjective:  Discuss medications   History of Present Illness: Jeffrey Herman is a 49 y.o. male with history of seropositive rheumatoid arthritis and osteoarthritis.  Patient has been on Humira 40 mg subcutaneous injections every 21 days.  He feels as though the injections have been wearing off several days prior to his next injection.  He has been having more frequent pain and inflammation in both hands and both feet.  A prednisone taper starting 20 mg tapering by 5 mg every 2 days was sent to the pharmacy on 01/26/2020 but he has not picked up the prescription yet.  His last dose of Humira was on 01/26/2020 and his symptoms have improved slightly.  He is still having pain and intermittent inflammation in both hands and both feet.  He has also noticed his right index finger has been locking on occasion.  He denies any psoriasis at this time.  He has not been using any topical agents.    Activities of Daily Living:  Patient reports morning stiffness for 1 hour.   Patient Denies nocturnal pain.  Difficulty dressing/grooming: Reports Difficulty climbing stairs: Denies Difficulty getting out of chair: Denies Difficulty using hands for taps, buttons, cutlery, and/or writing: Reports  Review of Systems  Constitutional: Negative for fatigue.  HENT: Negative for mouth sores, mouth dryness and nose dryness.   Eyes: Negative for visual disturbance and dryness.  Respiratory: Negative for shortness of breath and difficulty breathing.   Cardiovascular: Positive for swelling in legs/feet. Negative for chest pain and palpitations.  Gastrointestinal: Negative for blood in stool, constipation and diarrhea.  Endocrine: Negative for increased urination.  Genitourinary: Negative for  difficulty urinating and painful urination.  Musculoskeletal: Positive for arthralgias, joint pain, joint swelling and morning stiffness. Negative for myalgias, muscle weakness, muscle tenderness and myalgias.  Skin: Negative for color change, rash and redness.  Allergic/Immunologic: Negative for susceptible to infections.  Neurological: Negative for dizziness, headaches and memory loss.  Hematological: Negative for bruising/bleeding tendency.  Psychiatric/Behavioral: Negative for confusion and sleep disturbance.    PMFS History:  Patient Active Problem List   Diagnosis Date Noted  . High risk medication use 08/12/2016  . Primary osteoarthritis of both hands 08/12/2016  . Rheumatoid arthritis (HCC) 12/22/2010  . Depression 12/22/2010  . Elevated C-reactive protein (CRP) 12/16/2010  . Elevated sedimentation rate 12/11/2010  . Arthralgia of multiple sites 12/11/2010    History reviewed. No pertinent past medical history.  History reviewed. No pertinent family history. History reviewed. No pertinent surgical history. Social History   Social History Narrative  . Not on file   Immunization History  Administered Date(s) Administered  . Moderna SARS-COVID-2 Vaccination 08/05/2019, 09/02/2019     Objective: Vital Signs: BP (!) 142/83 (BP Location: Right Arm, Patient Position: Sitting, Cuff Size: Small)   Pulse 81   Resp 12   Ht 5\' 6"  (1.676 m)   Wt 149 lb 6.4 oz (67.8 kg)   BMI 24.11 kg/m    Physical Exam Vitals and nursing note reviewed.  Constitutional:      Appearance: He is well-developed.  HENT:     Head: Normocephalic and  atraumatic.  Eyes:     Conjunctiva/sclera: Conjunctivae normal.     Pupils: Pupils are equal, round, and reactive to light.  Pulmonary:     Effort: Pulmonary effort is normal.  Abdominal:     Palpations: Abdomen is soft.  Musculoskeletal:     Cervical back: Normal range of motion and neck supple.  Skin:    General: Skin is warm and dry.      Capillary Refill: Capillary refill takes less than 2 seconds.  Neurological:     Mental Status: He is alert and oriented to person, place, and time.  Psychiatric:        Behavior: Behavior normal.      Musculoskeletal Exam: C-spine, thoracic spine, and lumbar spine have good range of motion.  Shoulder joints have good range of motion with no discomfort.  Right elbow joint contracture noted.  No tenderness or inflammation of elbow joints.  Wrist joints, MCPs, PIPs, DIPs have good range of motion with no synovitis.  Right index trigger finger noted.  He has complete fist formation bilaterally.  Hip joints have good range of motion with no discomfort.  Knee joints have good range of motion with no warmth or effusion.  Ankle joints have good range of motion with no tenderness or inflammation.  No tenderness of MTP joints.  He has some tenderness of the third PIP joint.  CDAI Exam: CDAI Score: -- Patient Global: --; Provider Global: -- Swollen: 0 ; Tender: 2  Joint Exam 01/31/2020      Right  Left  MCP 2   Tender     PIP 3 (toe)      Tender   There is currently no information documented on the homunculus. Go to the Rheumatology activity and complete the homunculus joint exam.  Investigation: No additional findings.  Imaging: No results found.  Recent Labs: Lab Results  Component Value Date   WBC 7.6 04/11/2018   HGB 14.8 04/11/2018   PLT 241 04/11/2018   NA 138 04/11/2018   K 4.6 04/11/2018   CL 104 04/11/2018   CO2 29 04/11/2018   GLUCOSE 110 (H) 04/11/2018   BUN 14 04/11/2018   CREATININE 0.97 04/11/2018   BILITOT 0.5 04/11/2018   ALKPHOS 72 08/12/2016   AST 21 04/11/2018   ALT 18 04/11/2018   PROT 7.8 04/11/2018   ALBUMIN 4.1 08/12/2016   CALCIUM 9.5 04/11/2018   GFRAA 107 04/11/2018   QFTBGOLDPLUS NEGATIVE 04/11/2018    Speciality Comments: Prior therapy: Sulfasalazine (elevated LFT's) and MTX (inadequate response)  Procedures:  No procedures  performed Allergies: Patient has no known allergies.   Assessment / Plan:     Visit Diagnoses: Rheumatoid arthritis involving multiple sites with positive rheumatoid factor (HCC) - RF positive, CCP negative, ANA negative: He has been experiencing increased breakthrough pain leading up to each Humira injection.  He has been injecting Humira 40 mg subcutaneously every 21 days.  According to the patient he experiences increased pain and inflammation in both hands and both feet several days prior to his next injection.  A prednisone taper starting at 20 mg tapering by 5 mg every 2 days was sent to the pharmacy on 01/26/2020.  He was also advised to increase the frequency of Humira to every 14 days at that time.  His last injection of Humira was on 01/26/2020 which was 2 weeks after his previous injection.  We will try increasing the frequency of Humira injections every 14 days prior to making any  other medication changes.  He was in agreement.  He will return to the office in 1 month and every 3 months for lab work to monitor for drug toxicity.  X-rays of both hands and feet were obtained today to assess for radiographic progression as well.  He was advised to notify us if he continues to have joint pain or joint swelling despite increasing the frequency of Humira.  He will follow-up in the office in 3 months.- Plan: XR Hand 2 View Right, XR Hand 2 View Left, XR Foot 2 Views Right, XR Foot 2 Views Left, Sedimentation rate  High risk medication use - Humira 40 mg sq injections every 14 days.  He had lab work drawn on 11/03/2019.  We will repeat CBC and CMP today, in 1 month, then every 3 months.  TB gold negative on 11/03/2019 and will continue to be monitored yearly.- Plan: CBC with Differential/Platelet, COMPLETE METABOLIC PANEL WITH GFR He has received both COVID-19 vaccinations.  He was encouraged to continue to wear a mask and social distance.  He was given an informational handout about receiving the third dose  of the Covid vaccine.  Primary osteoarthritis of both hands: He has PIP and DIP thickening consistent with osteoarthritis of both hands.  No tenderness or inflammation was noted.  He has complete fist formation bilaterally.  He has been experiencing increased pain and stiffness in both hands recently.  Joint protection and muscle strengthening were discussed.  Trigger finger, right index finger: He has been experiencing intermittent tenderness and locking of the right index finger.  We discussed that if his symptoms persist or worsen he can return for an ultrasound-guided cortisone injection.  Psoriasis: He has no active psoriasis at this time.  He has not needed to use clobetasol cream recently.    Contracture of elbow joint, left: Improved.  He has no tenderness or inflammation.   Contracture of elbow joint, right: Chronic, unchanged.  He has no tenderness or inflammation on exam.   History of depression  Language barrier   Orders: Orders Placed This Encounter  Procedures  . XR Hand 2 View Right  . XR Hand 2 View Left  . XR Foot 2 Views Right  . XR Foot 2 Views Left  . CBC with Differential/Platelet  . COMPLETE METABOLIC PANEL WITH GFR  . Sedimentation rate   No orders of the defined types were placed in this encounter.    Follow-Up Instructions: Return in about 3 months (around 05/01/2020) for Rheumatoid arthritis.   Gearldine Bienenstock, PA-C  Note - This record has been created using Dragon software.  Chart creation errors have been sought, but may not always  have been located. Such creation errors do not reflect on  the standard of medical care.

## 2020-01-31 ENCOUNTER — Ambulatory Visit: Payer: Self-pay

## 2020-01-31 ENCOUNTER — Other Ambulatory Visit: Payer: Self-pay

## 2020-01-31 ENCOUNTER — Ambulatory Visit: Payer: Managed Care, Other (non HMO) | Admitting: Physician Assistant

## 2020-01-31 ENCOUNTER — Encounter: Payer: Self-pay | Admitting: Physician Assistant

## 2020-01-31 VITALS — BP 142/83 | HR 81 | Resp 12 | Ht 66.0 in | Wt 149.4 lb

## 2020-01-31 DIAGNOSIS — L409 Psoriasis, unspecified: Secondary | ICD-10-CM

## 2020-01-31 DIAGNOSIS — M19042 Primary osteoarthritis, left hand: Secondary | ICD-10-CM

## 2020-01-31 DIAGNOSIS — M0579 Rheumatoid arthritis with rheumatoid factor of multiple sites without organ or systems involvement: Secondary | ICD-10-CM | POA: Diagnosis not present

## 2020-01-31 DIAGNOSIS — M65321 Trigger finger, right index finger: Secondary | ICD-10-CM

## 2020-01-31 DIAGNOSIS — M24522 Contracture, left elbow: Secondary | ICD-10-CM

## 2020-01-31 DIAGNOSIS — M19041 Primary osteoarthritis, right hand: Secondary | ICD-10-CM

## 2020-01-31 DIAGNOSIS — M24521 Contracture, right elbow: Secondary | ICD-10-CM

## 2020-01-31 DIAGNOSIS — Z8659 Personal history of other mental and behavioral disorders: Secondary | ICD-10-CM

## 2020-01-31 DIAGNOSIS — Z789 Other specified health status: Secondary | ICD-10-CM

## 2020-01-31 DIAGNOSIS — Z79899 Other long term (current) drug therapy: Secondary | ICD-10-CM | POA: Diagnosis not present

## 2020-01-31 DIAGNOSIS — Z111 Encounter for screening for respiratory tuberculosis: Secondary | ICD-10-CM

## 2020-01-31 NOTE — Patient Instructions (Addendum)
Standing Labs We placed an order today for your standing lab work.   Please have your standing labs drawn in 1 month and every 3 months   If possible, please have your labs drawn 2 weeks prior to your appointment so that the provider can discuss your results at your appointment.  We have open lab daily Monday through Thursday from 8:30-12:30 PM and 1:30-4:30 PM and Friday from 8:30-12:30 PM and 1:30-4:00 PM at the office of Dr. Pollyann Savoy, [facility].   Please be advised, patients with office appointments requiring lab work will take precedents over walk-in lab work.  If possible, please come for your lab work on Monday and Friday afternoons, as you may experience shorter wait times. The office is located at [address], [address], [place], [place] [address] No appointment is necessary.   Labs are drawn by [facility]. Please bring your co-pay at the time of your lab draw.  You may receive a bill from [facility] for your lab work.  If you wish to have your labs drawn at another location, please call the office 24 hours in advance to send orders.  If you have any questions regarding directions or hours of operation,  please call [phone].   As a reminder, please drink plenty of water prior to coming for your lab work. Thanks!  COVID-19 vaccine recommendations:   COVID-19 vaccine is recommended for everyone (unless you are allergic to a vaccine component), even if you are on a medication that suppresses your immune system.   If you are on Methotrexate, Cellcept (mycophenolate), Rinvoq, Harriette Ohara, and Olumiant- hold the medication for 1 week after each vaccine. Hold Methotrexate for 2 weeks after the single dose COVID-19 vaccine.   If you are on Orencia subcutaneous injection - hold medication one week prior to and one week after the first COVID-19 vaccine dose (only).   If you are on Orencia IV infusions- time vaccination administration so that the first COVID-19 vaccination  will occur four weeks after the infusion and postpone the subsequent infusion by one week.   If you are on Cyclophosphamide or Rituxan infusions please contact your doctor prior to receiving the COVID-19 vaccine.   Do not take Tylenol or any anti-inflammatory medications (NSAIDs) 24 hours prior to the COVID-19 vaccination.   There is no direct evidence about the efficacy of the COVID-19 vaccine in individuals who are on medications that suppress the immune system.   Even if you are fully vaccinated, and you are on any medications that suppress your immune system, please continue to wear a mask, maintain at least six feet social distance and practice hand hygiene.   If you develop a COVID-19 infection, please contact your PCP or our office to determine if you need antibody infusion.  The booster vaccine is now available for immunocompromised patients. It is advised that if you had Pfizer vaccine you should get [employer] booster.  If you had a Moderna vaccine then you should get a Moderna booster. Johnson and [employer] does not have a booster vaccine at this time.  Please see the following web sites for updated information.   https://[email or web address]/[email or web address]/0/Files/[email or web address]-19-Vaccination-Patient-Resources.[email or web address]  https://[email or web address].org/About-Us/Newsroom/Press-Releases/ID/1159

## 2020-02-01 LAB — CBC WITH DIFFERENTIAL/PLATELET
Absolute Monocytes: 435 cells/uL (ref 200–950)
Basophils Absolute: 48 cells/uL (ref 0–200)
Basophils Relative: 0.7 %
Eosinophils Absolute: 272 cells/uL (ref 15–500)
Eosinophils Relative: 4 %
HCT: 40.8 % (ref 38.5–50.0)
Hemoglobin: 14 g/dL (ref 13.2–17.1)
Lymphs Abs: 2278 cells/uL (ref 850–3900)
MCH: 28 pg (ref 27.0–33.0)
MCHC: 34.3 g/dL (ref 32.0–36.0)
MCV: 81.6 fL (ref 80.0–100.0)
MPV: 9.9 fL (ref 7.5–12.5)
Monocytes Relative: 6.4 %
Neutro Abs: 3767 cells/uL (ref 1500–7800)
Neutrophils Relative %: 55.4 %
Platelets: 246 10*3/uL (ref 140–400)
RBC: 5 10*6/uL (ref 4.20–5.80)
RDW: 12.6 % (ref 11.0–15.0)
Total Lymphocyte: 33.5 %
WBC: 6.8 10*3/uL (ref 3.8–10.8)

## 2020-02-01 LAB — COMPLETE METABOLIC PANEL WITH GFR
AG Ratio: 1.1 (calc) (ref 1.0–2.5)
ALT: 16 U/L (ref 9–46)
AST: 22 U/L (ref 10–35)
Albumin: 4 g/dL (ref 3.6–5.1)
Alkaline phosphatase (APISO): 88 U/L (ref 35–144)
BUN: 14 mg/dL (ref 7–25)
CO2: 27 mmol/L (ref 20–32)
Calcium: 8.8 mg/dL (ref 8.6–10.3)
Chloride: 105 mmol/L (ref 98–110)
Creat: 0.98 mg/dL (ref 0.70–1.33)
GFR, Est African American: 104 mL/min/{1.73_m2} (ref >=60)
GFR, Est Non African American: 90 mL/min/{1.73_m2} (ref >=60)
Globulin: 3.7 g/dL (calc) (ref 1.9–3.7)
Glucose, Bld: 107 mg/dL — ABNORMAL HIGH (ref 65–99)
Potassium: 4.1 mmol/L (ref 3.5–5.3)
Sodium: 139 mmol/L (ref 135–146)
Total Bilirubin: 0.4 mg/dL (ref 0.2–1.2)
Total Protein: 7.7 g/dL (ref 6.1–8.1)

## 2020-02-01 LAB — SEDIMENTATION RATE: Sed Rate: 2 mm/h (ref 0–15)

## 2020-02-01 NOTE — Progress Notes (Signed)
Glucose is 107. Rest of CMP WNL. CBC WNL. ESR WNL.   Dr. Estanislado Pandy will review x-rays today.  Please call the patients daughter with lab results and x-ray results.

## 2020-02-05 ENCOUNTER — Other Ambulatory Visit: Payer: Self-pay | Admitting: *Deleted

## 2020-02-05 MED ORDER — HUMIRA (2 PEN) 40 MG/0.4ML ~~LOC~~ AJKT: 40.0000 mg | AUTO-INJECTOR | SUBCUTANEOUS | 0 refills | Status: DC

## 2020-02-05 NOTE — Telephone Encounter (Signed)
Refill request received via fax  Last Visit: 01/31/2020 Next Visit: 05/01/2020 Labs: 01/31/2020 Glucose is 107. Rest of CMP WNL. CBC WNL. TB Gold: 11/03/2019 Neg   Current Dose per office note 01/31/2020: Humira 40 mg sq injections every 14 days  DX:  Rheumatoid arthritis involving multiple sites with positive rheumatoid factor  Okay to refill per Dr. Corliss Skains

## 2020-04-17 NOTE — Progress Notes (Signed)
Office Visit Note  Patient: Jeffrey Herman             Date of Birth: 03-28-1971           MRN: [id number]             PCP: Patient, No Pcp Per Referring: No ref. provider found Visit Date: 05/01/2020 Occupation: @GUAROCC @    Subjective:  Right index finger pain   History of Present Illness: Jeffrey Herman is a 49 y.o. male with history of seropositive rheumatoid arthritis, osteoarthritis, and psoriasis.  He is on humira 40 mg sq every 14 days.  According to the patient she missed about 2 months of Humira about 2 months ago due to requiring a refill.  He has resumed Humira injections and has not missed any doses recently.  His most recent injection was on 04/26/2020.  According to the patient while off of Humira he was experiencing increased pain and swelling in his right index finger.  He states that his right index finger continues to lock at times.  He states while February he was also experiencing increased lower back pain and stiffness which has since resolved.  He continues to have psoriasis on his scalp as well as a few large patches on his back.  He has not been using any topical agents. According to the patient his dentist prescribed him a prescription for clindamycin which he is to take for 14 days and he is unsure when and if he should hold his dose of Humira. He has received both COVID-19 vaccinations and plans to receive the booster dose.     Activities of Daily Living:  Patient reports morning stiffness for 0  minutes.   Patient Denies nocturnal pain.  Difficulty dressing/grooming: Denies Difficulty climbing stairs: Denies Difficulty getting out of chair: Denies Difficulty using hands for taps, buttons, cutlery, and/or writing: Reports  Review of Systems  Constitutional: Negative for fatigue and night sweats.  HENT: Negative for mouth sores, mouth dryness and nose dryness.   Eyes: Positive for dryness. Negative for redness.  Respiratory: Negative for shortness of breath  and difficulty breathing.   Cardiovascular: Negative for chest pain, palpitations, hypertension, irregular heartbeat and swelling in legs/feet.  Gastrointestinal: Negative for blood in stool, constipation and diarrhea.  Endocrine: Negative for increased urination.  Genitourinary: Negative for painful urination.  Musculoskeletal: Positive for arthralgias, joint pain and joint swelling. Negative for myalgias, muscle weakness, morning stiffness, muscle tenderness and myalgias.  Skin: Negative for color change, rash, hair loss, nodules/bumps, skin tightness, ulcers and sensitivity to sunlight.  Allergic/Immunologic: Negative for susceptible to infections.  Neurological: Negative for dizziness, fainting, headaches, memory loss, night sweats and weakness.  Hematological: Negative for swollen glands.  Psychiatric/Behavioral: Negative for depressed mood, confusion and sleep disturbance. The patient is not nervous/anxious.     PMFS History:  Patient Active Problem List   Diagnosis Date Noted  . High risk medication use 08/12/2016  . Primary osteoarthritis of both hands 08/12/2016  . Rheumatoid arthritis (HCC) 12/22/2010  . Depression 12/22/2010  . Elevated C-reactive protein (CRP) 12/16/2010  . Elevated sedimentation rate 12/11/2010  . Arthralgia of multiple sites 12/11/2010    History reviewed. No pertinent past medical history.  History reviewed. No pertinent family history. History reviewed. No pertinent surgical history. Social History   Social History Narrative  . Not on file   Immunization History  Administered Date(s) Administered  . Moderna Sars-Covid-2 Vaccination 08/05/2019, 09/02/2019     Objective: Vital Signs: BP  124/83 (BP Location: Right Arm, Patient Position: Sitting, Cuff Size: Normal)   Pulse 86   Resp 15   Ht 5\' 6"  (1.676 m)   Wt 150 lb 3.2 oz (68.1 kg)   BMI 24.24 kg/m    Physical Exam Vitals and nursing note reviewed.  Constitutional:      Appearance: He  is well-developed and well-nourished.  HENT:     Head: Normocephalic and atraumatic.  Eyes:     Extraocular Movements: EOM normal.     Conjunctiva/sclera: Conjunctivae normal.     Pupils: Pupils are equal, round, and reactive to light.  Pulmonary:     Effort: Pulmonary effort is normal.  Abdominal:     Palpations: Abdomen is soft.  Musculoskeletal:     Cervical back: Normal range of motion and neck supple.  Skin:    General: Skin is warm and dry.     Capillary Refill: Capillary refill takes less than 2 seconds.  Neurological:     Mental Status: He is alert and oriented to person, place, and time.  Psychiatric:        Mood and Affect: Mood and affect normal.        Behavior: Behavior normal.      Musculoskeletal Exam: C-spine, thoracic spine, and lumbar spine good ROM.  No midline spinal tenderness.  No SI joint tenderness.  Shoulder joints, elbow joints, wrist joints, MCPs, PIPs, and DIPs good ROM with no synovitis.  Complete fist formation bilaterally.  PIP and DIP thickening consistent with osteoarthritis of both hands.  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 Hip joints good ROM with no discomfort.  Knee joints good ROM with no discomfort.  No warmth or effusion of knee joints.  No tenderness or swelling of ankle joints.  No tenderness over MTP joints.  PIP and DIP thickening consistent with osteoarthritis of both hands.   CDAI Exam: CDAI Score: -- Patient Global: --; Provider Global: -- Swollen: --; Tender: -- Joint Exam 05/01/2020   No joint exam has been documented for this visit   There is currently no information documented on the homunculus. Go to the Rheumatology activity and complete the homunculus joint exam.  Investigation: No additional findings.  Imaging: No results found.  Recent Labs: Lab Results  Component Value Date   WBC 6.8 01/31/2020   HGB 14.0 01/31/2020   PLT 246 01/31/2020   NA 139 01/31/2020   K 4.1 01/31/2020   CL 105 01/31/2020   CO2  27 01/31/2020   GLUCOSE 107 (H) 01/31/2020   BUN 14 01/31/2020   CREATININE 0.98 01/31/2020   BILITOT 0.4 01/31/2020   ALKPHOS 72 08/12/2016   AST 22 01/31/2020   ALT 16 01/31/2020   PROT 7.7 01/31/2020   ALBUMIN 4.1 08/12/2016   CALCIUM 8.8 01/31/2020   GFRAA 104 01/31/2020   QFTBGOLDPLUS NEGATIVE 04/11/2018    Speciality Comments: Prior therapy: Sulfasalazine (elevated LFT's) and MTX (inadequate response)  Procedures:  No procedures performed Allergies: Patient has no known allergies.   Assessment / Plan:     Visit Diagnoses: Rheumatoid arthritis involving multiple sites with positive rheumatoid factor (HCC) - RF positive, CCP negative, ANA negative:  He has no synovitis on examination today.  He is clinically doing well on Humira 40 mg sq injections every 14 days.  He missed about 4 doses of humira a couple months ago due to requiring refills, and during that time he was experiencing increased pain and inflammation in the right index finger, lower  back, and increased scalp psoriasis.  His joint pain and inflammation resolve once he resumed humira injections.  He has no joint tenderness or synovitis on exam today.  He will continue on humira 40 mg sq injections once every 14 days. He was advised to notify us if he develops recurrent flares. He will follow up in the office in 5 months.  High risk medication use - Humira 40 mg sq injections every 14 days.CBC and CMP updated on 01/31/20.  CBC and CMP will be drawn today to monitor drug toxicity.  Orders for CBC and CMP were released today.  His next lab work will be due in March and every 3 months.  Standing orders for CBC and CMP are in place. TB gold negative on 11/03/19 and will continue to be monitored yearly.     - Plan: CBC with Differential/Platelet, COMPLETE METABOLIC PANEL WITH GFR He was advised to hold humira while taking clindamycin prescribed by his dentist.  He was advised to resume humira once the infection has completely cleared  and he has completed the prescription for clindamycin.  We discussed the importance of holding humira anytime he has an infection and to resume once the infection has completely cleared.   He has received both covid-19 vaccine doses and is planning on receiving the booster dose.   Primary osteoarthritis of both hands: PIP and DIP thickening consistent with osteoarthritis of both hands.  Mild tendernessin the right 2nd PIP joint. Swan neck deformity of the right index finger noted.  Declined ultrasound cortisone injection today. Complete fist formation bilaterally.    Psoriasis: He has scalp psoriasis and a few large patches of plaque psoriasis on his back. A prescription for clobetasol cream for his back and clobetasol shampoo for his scalp was sent to the pharmacy today.  He was advised to continue on humira as prescribed.   Contracture of elbow joint, left: Unchanged. No tenderness or inflammation noted on exam.  Contracture of elbow joint, right: Mild. No tenderness or inflammation noted on exam.  Trigger finger, right index finger -He experiences intermittent locking.   History of depression: Denied depressed mood today in the office.   Language barrier: Interpreter: Thomasene Mohair   Orders: Orders Placed This Encounter  Procedures  . US Guided Needle Placement  . CBC with Differential/Platelet  . COMPLETE METABOLIC PANEL WITH GFR   Meds ordered this encounter  Medications  . clobetasol cream (TEMOVATE) 0.05 %    Sig: Apply 1 application topically 2 (two) times daily.    Dispense:  45 g    Refill:  0  . Clobetasol Propionate 0.05 % shampoo    Sig: Apply thin film to dry scalp once daily. Leave in place for 15 minutes then add water, lather, and rinse thoroughly. Limit treatment to 4 consecutive weeks.    Dispense:  118 mL    Refill:  0      Follow-Up Instructions: Return in about 3 months (around 07/30/2020) for Rheumatoid arthritis, Osteoarthritis.   Gearldine Bienenstock,  PA-C  Note - This record has been created using Dragon software.  Chart creation errors have been sought, but may not always  have been located. Such creation errors do not reflect on  the standard of medical care.

## 2020-04-26 ENCOUNTER — Telehealth: Payer: Self-pay | Admitting: *Deleted

## 2020-04-26 NOTE — Telephone Encounter (Signed)
Reviewed the following with patient's daughter and she will advise patient.  COVID-19 vaccine recommendations:   COVID-19 vaccine is recommended for everyone (unless you are allergic to a vaccine component), even if you are on a medication that suppresses your immune system.   If you are on Methotrexate, Cellcept (mycophenolate), Rinvoq, Harriette Ohara, and Olumiant- hold the medication for 1 week after each vaccine. Hold Methotrexate for 2 weeks after the single dose COVID-19 vaccine.   If you are on Orencia subcutaneous injection - hold medication one week prior to and one week after the first COVID-19 vaccine dose (only).   If you are on Orencia IV infusions- time vaccination administration so that the first COVID-19 vaccination will occur four weeks after the infusion and postpone the subsequent infusion by one week.   If you are on Cyclophosphamide or Rituxan infusions please contact your doctor prior to receiving the COVID-19 vaccine.   Do not take Tylenol or any anti-inflammatory medications (NSAIDs) 24 hours prior to the COVID-19 vaccination.   There is no direct evidence about the efficacy of the COVID-19 vaccine in individuals who are on medications that suppress the immune system.   Even if you are fully vaccinated, and you are on any medications that suppress your immune system, please continue to wear a mask, maintain at least six feet social distance and practice hand hygiene.   If you develop a COVID-19 infection, please contact your PCP or our office to determine if you need monoclonal antibody infusion.  The booster vaccine is now available for immunocompromised patients.   Please see the following web sites for updated information.   [email or web address]

## 2020-04-30 ENCOUNTER — Other Ambulatory Visit: Payer: Self-pay | Admitting: Rheumatology

## 2020-04-30 NOTE — Telephone Encounter (Signed)
Last Visit: 01/31/2020 Next Visit: 05/01/2020 Labs: 01/31/2020 Glucose is 107. Rest of CMP WNL. CBC WNL. TB Gold: 11/03/2019 Neg   Current Dose per office note 01/31/2020: Humira 40 mg sq injections every14 days  DX: Rheumatoid arthritis involving multiple sites with positive rheumatoid factor  Patient to update labs at appointment on 05/01/2020  Okay to refill Humira?

## 2020-05-01 ENCOUNTER — Ambulatory Visit: Payer: Self-pay

## 2020-05-01 ENCOUNTER — Ambulatory Visit (INDEPENDENT_AMBULATORY_CARE_PROVIDER_SITE_OTHER): Payer: Managed Care, Other (non HMO) | Admitting: Physician Assistant

## 2020-05-01 ENCOUNTER — Other Ambulatory Visit: Payer: Self-pay

## 2020-05-01 ENCOUNTER — Encounter: Payer: Self-pay | Admitting: Physician Assistant

## 2020-05-01 VITALS — BP 124/83 | HR 86 | Resp 15 | Ht 66.0 in | Wt 150.2 lb

## 2020-05-01 DIAGNOSIS — L409 Psoriasis, unspecified: Secondary | ICD-10-CM

## 2020-05-01 DIAGNOSIS — M19042 Primary osteoarthritis, left hand: Secondary | ICD-10-CM

## 2020-05-01 DIAGNOSIS — Z79899 Other long term (current) drug therapy: Secondary | ICD-10-CM

## 2020-05-01 DIAGNOSIS — M19041 Primary osteoarthritis, right hand: Secondary | ICD-10-CM

## 2020-05-01 DIAGNOSIS — M0579 Rheumatoid arthritis with rheumatoid factor of multiple sites without organ or systems involvement: Secondary | ICD-10-CM | POA: Diagnosis not present

## 2020-05-01 DIAGNOSIS — Z8659 Personal history of other mental and behavioral disorders: Secondary | ICD-10-CM

## 2020-05-01 DIAGNOSIS — Z789 Other specified health status: Secondary | ICD-10-CM

## 2020-05-01 DIAGNOSIS — M24522 Contracture, left elbow: Secondary | ICD-10-CM

## 2020-05-01 DIAGNOSIS — M65321 Trigger finger, right index finger: Secondary | ICD-10-CM

## 2020-05-01 DIAGNOSIS — M24521 Contracture, right elbow: Secondary | ICD-10-CM

## 2020-05-01 MED ORDER — CLOBETASOL PROPIONATE 0.05 % EX SHAM: MEDICATED_SHAMPOO | CUTANEOUS | 0 refills | Status: DC

## 2020-05-01 MED ORDER — CLOBETASOL PROPIONATE 0.05 % EX CREA: 1.0000 "application " | TOPICAL_CREAM | Freq: Two times a day (BID) | CUTANEOUS | 0 refills | Status: DC

## 2020-05-01 NOTE — Patient Instructions (Addendum)
Hold humira while taking clindamycin.  You can resume humira injections once the infection as cleared      Standing Labs We placed an order today for your standing lab work.   Please have your standing labs drawn in March and every 3 months   If possible, please have your labs drawn 2 weeks prior to your appointment so that the provider can discuss your results at your appointment.  We have open lab daily Monday through Thursday from 8:30-12:30 PM and 1:30-4:30 PM and Friday from 8:30-12:30 PM and 1:30-4:00 PM at the office of Dr. Pollyann Savoy, [facility].   Please be advised, patients with office appointments requiring lab work will take precedents over walk-in lab work.  If possible, please come for your lab work on Monday and Friday afternoons, as you may experience shorter wait times. The office is located at [address], [place], [place] [address] No appointment is necessary.   Labs are drawn by [facility]. Please bring your co-pay at the time of your lab draw.  You may receive a bill from [facility] for your lab work.  If you wish to have your labs drawn at another location, please call the office 24 hours in advance to send orders.  If you have any questions regarding directions or hours of operation,  please call [phone].   As a reminder, please drink plenty of water prior to coming for your lab work. Thanks!

## 2020-05-02 LAB — COMPLETE METABOLIC PANEL WITH GFR
AG Ratio: 1.3 (calc) (ref 1.0–2.5)
ALT: 17 U/L (ref 9–46)
AST: 20 U/L (ref 10–35)
Albumin: 4.2 g/dL (ref 3.6–5.1)
Alkaline phosphatase (APISO): 84 U/L (ref 35–144)
BUN: 16 mg/dL (ref 7–25)
CO2: 28 mmol/L (ref 20–32)
Calcium: 8.9 mg/dL (ref 8.6–10.3)
Chloride: 105 mmol/L (ref 98–110)
Creat: 0.88 mg/dL (ref 0.70–1.33)
GFR, Est African American: 116 mL/min/{1.73_m2} (ref >=60)
GFR, Est Non African American: 100 mL/min/{1.73_m2} (ref >=60)
Globulin: 3.3 g/dL (calc) (ref 1.9–3.7)
Glucose, Bld: 145 mg/dL — ABNORMAL HIGH (ref 65–99)
Potassium: 4 mmol/L (ref 3.5–5.3)
Sodium: 139 mmol/L (ref 135–146)
Total Bilirubin: 0.4 mg/dL (ref 0.2–1.2)
Total Protein: 7.5 g/dL (ref 6.1–8.1)

## 2020-05-02 LAB — CBC WITH DIFFERENTIAL/PLATELET
Absolute Monocytes: 569 cells/uL (ref 200–950)
Basophils Absolute: 39 cells/uL (ref 0–200)
Basophils Relative: 0.5 %
Eosinophils Absolute: 328 cells/uL (ref 15–500)
Eosinophils Relative: 4.2 %
HCT: 42 % (ref 38.5–50.0)
Hemoglobin: 14.4 g/dL (ref 13.2–17.1)
Lymphs Abs: 2395 cells/uL (ref 850–3900)
MCH: 27.7 pg (ref 27.0–33.0)
MCHC: 34.3 g/dL (ref 32.0–36.0)
MCV: 80.8 fL (ref 80.0–100.0)
MPV: 10.6 fL (ref 7.5–12.5)
Monocytes Relative: 7.3 %
Neutro Abs: 4469 cells/uL (ref 1500–7800)
Neutrophils Relative %: 57.3 %
Platelets: 236 10*3/uL (ref 140–400)
RBC: 5.2 10*6/uL (ref 4.20–5.80)
RDW: 13 % (ref 11.0–15.0)
Total Lymphocyte: 30.7 %
WBC: 7.8 10*3/uL (ref 3.8–10.8)

## 2020-05-02 NOTE — Progress Notes (Signed)
Glucose is elevated-145.  Rest of CMP WNL.  CBC WNL

## 2020-06-06 ENCOUNTER — Telehealth: Payer: Self-pay

## 2020-06-06 NOTE — Telephone Encounter (Signed)
Jeffrey Herman called stating patient is having oral surgery tomorrow 06/07/20 and the dentist is prescribing an antibiotic.  Giselle requested a return call at #[phone]

## 2020-06-06 NOTE — Telephone Encounter (Signed)
Patient is having oral surgery tomorrow and is going to be given Motrin and Clindamycin prescriptions. Advised to hold Humira while on the Clindamycin. Jeffrey Herman expressed understanding.

## 2020-06-18 ENCOUNTER — Other Ambulatory Visit: Payer: Self-pay | Admitting: Rheumatology

## 2020-06-18 NOTE — Telephone Encounter (Signed)
Last Visit: 05/01/2020 Next Visit: 07/31/2020 Labs: 05/01/2020 Glucose is elevated-145. Rest of CMP WNL. CBC WNL. TB Gold: 11/03/2019 Neg   Current Dose per office note 05/01/2020: Humira 40 mg sq injections every 14 days  DX: Rheumatoid arthritis involving multiple sites with positive rheumatoid factor   Okay to refill per Dr. Corliss Skains

## 2020-07-17 NOTE — Progress Notes (Deleted)
   Office Visit Note  Patient: Jeffrey Herman             Date of Birth: 1970/07/28           MRN: [id number]             PCP: Patient, No Pcp Per Referring: No ref. provider found Visit Date: 07/31/2020 Occupation: @GUAROCC @  Subjective:  No chief complaint on file.   History of Present Illness: Jeffrey Herman is a 50 y.o. male ***   Activities of Daily Living:  Patient reports morning stiffness for *** {minute/hour:19697}.   Patient {ACTIONS;DENIES/REPORTS:21021675::"Denies"} nocturnal pain.  Difficulty dressing/grooming: {ACTIONS;DENIES/REPORTS:21021675::"Denies"} Difficulty climbing stairs: {ACTIONS;DENIES/REPORTS:21021675::"Denies"} Difficulty getting out of chair: {ACTIONS;DENIES/REPORTS:21021675::"Denies"} Difficulty using hands for taps, buttons, cutlery, and/or writing: {ACTIONS;DENIES/REPORTS:21021675::"Denies"}  No Rheumatology ROS completed.   PMFS History:  Patient Active Problem List   Diagnosis Date Noted  . High risk medication use 08/12/2016  . Primary osteoarthritis of both hands 08/12/2016  . Rheumatoid arthritis (HCC) 12/22/2010  . Depression 12/22/2010  . Elevated C-reactive protein (CRP) 12/16/2010  . Elevated sedimentation rate 12/11/2010  . Arthralgia of multiple sites 12/11/2010    No past medical history on file.  No family history on file. No past surgical history on file. Social History   Social History Narrative  . Not on file   Immunization History  Administered Date(s) Administered  . Moderna Sars-Covid-2 Vaccination 08/05/2019, 09/02/2019     Objective: Vital Signs: There were no vitals taken for this visit.   Physical Exam   Musculoskeletal Exam: ***  CDAI Exam: CDAI Score: -- Patient Global: --; Provider Global: -- Swollen: --; Tender: -- Joint Exam 07/31/2020   No joint exam has been documented for this visit   There is currently no information documented on the homunculus. Go to the Rheumatology activity and complete the  homunculus joint exam.  Investigation: No additional findings.  Imaging: No results found.  Recent Labs: Lab Results  Component Value Date   WBC 7.8 05/01/2020   HGB 14.4 05/01/2020   PLT 236 05/01/2020   NA 139 05/01/2020   K 4.0 05/01/2020   CL 105 05/01/2020   CO2 28 05/01/2020   GLUCOSE 145 (H) 05/01/2020   BUN 16 05/01/2020   CREATININE 0.88 05/01/2020   BILITOT 0.4 05/01/2020   ALKPHOS 72 08/12/2016   AST 20 05/01/2020   ALT 17 05/01/2020   PROT 7.5 05/01/2020   ALBUMIN 4.1 08/12/2016   CALCIUM 8.9 05/01/2020   GFRAA 116 05/01/2020   QFTBGOLDPLUS NEGATIVE 04/11/2018    Speciality Comments: Prior therapy: Sulfasalazine (elevated LFT's) and MTX (inadequate response)  Procedures:  No procedures performed Allergies: Patient has no known allergies.   Assessment / Plan:     Visit Diagnoses: No diagnosis found.  Orders: No orders of the defined types were placed in this encounter.  No orders of the defined types were placed in this encounter.   Face-to-face time spent with patient was *** minutes. Greater than 50% of time was spent in counseling and coordination of care.  Follow-Up Instructions: No follow-ups on file.   04/13/2018, CMA  Note - This record has been created using Ellen Henri.  Chart creation errors have been sought, but may not always  have been located. Such creation errors do not reflect on  the standard of medical care.

## 2020-07-31 ENCOUNTER — Ambulatory Visit: Payer: Managed Care, Other (non HMO) | Admitting: Physician Assistant

## 2020-07-31 DIAGNOSIS — Z8659 Personal history of other mental and behavioral disorders: Secondary | ICD-10-CM

## 2020-07-31 DIAGNOSIS — M24522 Contracture, left elbow: Secondary | ICD-10-CM

## 2020-07-31 DIAGNOSIS — M19041 Primary osteoarthritis, right hand: Secondary | ICD-10-CM

## 2020-07-31 DIAGNOSIS — L409 Psoriasis, unspecified: Secondary | ICD-10-CM

## 2020-07-31 DIAGNOSIS — M24521 Contracture, right elbow: Secondary | ICD-10-CM

## 2020-07-31 DIAGNOSIS — M0579 Rheumatoid arthritis with rheumatoid factor of multiple sites without organ or systems involvement: Secondary | ICD-10-CM

## 2020-07-31 DIAGNOSIS — Z79899 Other long term (current) drug therapy: Secondary | ICD-10-CM

## 2020-07-31 DIAGNOSIS — Z789 Other specified health status: Secondary | ICD-10-CM

## 2020-07-31 DIAGNOSIS — M65321 Trigger finger, right index finger: Secondary | ICD-10-CM

## 2020-08-07 ENCOUNTER — Ambulatory Visit: Payer: Managed Care, Other (non HMO) | Admitting: Physician Assistant

## 2020-08-07 NOTE — Progress Notes (Signed)
Office Visit Note  Patient: Jeffrey Herman             Date of Birth: 08-Mar-1971           MRN: [id number]             PCP: Patient, No Pcp Per (Inactive) Referring: No ref. provider found Visit Date: 08/21/2020 Occupation: @GUAROCC @  Subjective:  Right index trigger finger   History of Present Illness: Leocadio Heal is a 50 y.o. male with history of seropositive rheumatoid arthritis, psoriasis, and osteoarthritis.  He is on humira 40 mg sq injections every 14 days.  He has not missed any doses of Humira recently.  He has not had any recent flares.  He denies any joint pain or joint swelling.  He continues to experience locking and tenderness of the right index finger.  He states he works 7 days a week so it will be hard for him to schedule a cortisone injection but he would like to try to have it performed at his next office visit.  He has ongoing psoriasis on his scalp and has been using clobetasol cream topically.  He would like a refill of clobetasol sent to the pharmacy. Patient reports that last Friday he had 2 wisdom teeth removed.  He is currently on clindamycin which she will complete on Sunday.  He did not have to hold Humira prior to the wisdom tooth extraction.  He is not experiencing any symptoms of an infection at this time.  He is not experiencing any pain at this time. He denies any other infections recently.   Activities of Daily Living:  Patient reports morning stiffness for 0 minutes.   Patient Denies nocturnal pain.  Difficulty dressing/grooming: Denies Difficulty climbing stairs: Denies Difficulty getting out of chair: Denies Difficulty using hands for taps, buttons, cutlery, and/or writing: Denies  Review of Systems  Constitutional: Negative for fatigue.  HENT: Negative for mouth sores, mouth dryness and nose dryness.   Eyes: Negative for pain, itching and dryness.  Respiratory: Negative for shortness of breath and difficulty breathing.   Cardiovascular: Negative  for chest pain and palpitations.  Gastrointestinal: Negative for blood in stool, constipation and diarrhea.  Endocrine: Negative for increased urination.  Genitourinary: Negative for difficulty urinating.  Musculoskeletal: Negative for arthralgias, joint pain, joint swelling, myalgias, morning stiffness, muscle tenderness and myalgias.  Skin: Negative for color change, rash and redness.  Allergic/Immunologic: Negative for susceptible to infections.  Neurological: Negative for dizziness, numbness, headaches, memory loss and weakness.  Hematological: Negative for bruising/bleeding tendency.  Psychiatric/Behavioral: Negative for confusion.    PMFS History:  Patient Active Problem List   Diagnosis Date Noted  . High risk medication use 08/12/2016  . Primary osteoarthritis of both hands 08/12/2016  . Rheumatoid arthritis (HCC) 12/22/2010  . Depression 12/22/2010  . Elevated C-reactive protein (CRP) 12/16/2010  . Elevated sedimentation rate 12/11/2010  . Arthralgia of multiple sites 12/11/2010    History reviewed. No pertinent past medical history.  History reviewed. No pertinent family history. Past Surgical History:  Procedure Laterality Date  . WISDOM TOOTH EXTRACTION  08/16/2020   2 wisdom teeth    Social History   Social History Narrative  . Not on file   Immunization History  Administered Date(s) Administered  . Moderna Sars-Covid-2 Vaccination 08/05/2019, 09/02/2019, 07/05/2020     Objective: Vital Signs: BP 127/82 (BP Location: Right Arm, Patient Position: Sitting, Cuff Size: Normal)   Pulse 85   Resp 16   Ht  5\' 6"  (1.676 m)   Wt 149 lb (67.6 kg)   BMI 24.05 kg/m    Physical Exam Vitals and nursing note reviewed.  Constitutional:      Appearance: He is well-developed.  HENT:     Head: Normocephalic and atraumatic.  Eyes:     Conjunctiva/sclera: Conjunctivae normal.     Pupils: Pupils are equal, round, and reactive to light.  Pulmonary:     Effort:  Pulmonary effort is normal.  Abdominal:     Palpations: Abdomen is soft.  Musculoskeletal:     Cervical back: Normal range of motion and neck supple.  Skin:    General: Skin is warm and dry.     Capillary Refill: Capillary refill takes less than 2 seconds.  Neurological:     Mental Status: He is alert and oriented to person, place, and time.  Psychiatric:        Behavior: Behavior normal.      Musculoskeletal Exam: C-spine, thoracic spine, lumbar spine have good range of motion with no discomfort.  Shoulder joints, elbow joints, wrist joints, MCPs, PIPs, DIPs good range of motion with no synovitis.  Right index trigger finger noted.  Swelling over the right second PIP joint.  Complete fist formation bilaterally.  Mild PIP and DIP thickening consistent with osteoarthritis of both hands.  Hip joints have good range of motion with no discomfort.  No tenderness over trochanteric bursa bilaterally.  Knee joints have good range of motion with no warmth or effusion.  Ankle joints have good range of motion with no tenderness or inflammation.  CDAI Exam: CDAI Score: 0.2  Patient Global: 1 mm; Provider Global: 1 mm Swollen: 0 ; Tender: 0  Joint Exam 08/21/2020   No joint exam has been documented for this visit   There is currently no information documented on the homunculus. Go to the Rheumatology activity and complete the homunculus joint exam.  Investigation: No additional findings.  Imaging: No results found.  Recent Labs: Lab Results  Component Value Date   WBC 7.8 05/01/2020   HGB 14.4 05/01/2020   PLT 236 05/01/2020   NA 139 05/01/2020   K 4.0 05/01/2020   CL 105 05/01/2020   CO2 28 05/01/2020   GLUCOSE 145 (H) 05/01/2020   BUN 16 05/01/2020   CREATININE 0.88 05/01/2020   BILITOT 0.4 05/01/2020   ALKPHOS 72 08/12/2016   AST 20 05/01/2020   ALT 17 05/01/2020   PROT 7.5 05/01/2020   ALBUMIN 4.1 08/12/2016   CALCIUM 8.9 05/01/2020   GFRAA 116 05/01/2020   QFTBGOLDPLUS  NEGATIVE 04/11/2018    Speciality Comments: Prior therapy: Sulfasalazine (elevated LFT's) and MTX (inadequate response)  Procedures:  No procedures performed Allergies: Patient has no known allergies.    Assessment / Plan:     Visit Diagnoses: Rheumatoid arthritis involving multiple sites with positive rheumatoid factor (HCC) - RF positive, CCP negative, ANA negative: He has no synovitis on exam.  He has not had any recent rheumatoid arthritis flares.  He is clinically doing well on Humira 40 mg subcutaneous injections every 14 days.  He has not missed any doses of Humira recently.  He is not experiencing any joint pain or inflammation currently.  He has not had any morning stiffness or nocturnal pain.  He has no difficulty with ADLs.  He will continue on Humira as prescribed.  He was advised to notify 04/13/2018 if he develops increased joint pain or joint swelling.  He will follow-up in the  office in 5 months.  High risk medication use -  Humira 40 mg sq injections every 14 days.  CBC and CMP updated on 05/01/20.  He is due to update lab work.  Orders for CBC and CMP were released.  His next lab work will be due in July and every 3 months to monitor for drug toxicity.  Standing orders for CBC and CMP remain in place.  TB gold negative on 11/03/19 and will continue to monitor yearly.  - Plan: CBC with Differential/Platelet, COMPLETE METABOLIC PANEL WITH GFR He has not had any recent infections.  We discussed the importance of holding Humira if he develops signs or symptoms of an infection and to resume once the infection has completely cleared.  He voiced understanding. He has received 3 moderna covid-19 vaccine doses.   He was advised to continue to hold Humira until he has completed the prescription for clindamycin.  He had 2 wisdom teeth extracted on Friday and has not exhibited any signs of infection.  He is not having any pain at this time.  Primary osteoarthritis of both hands: He has mild PIP and  DIP thickening consistent with osteoarthritis of both hands.  Tenderness over the right second PIP joint noted.  He is able to make a complete fist bilaterally.  Discussed the importance of joint protection and muscle strengthening.  Psoriasis: Scalp-He requested a refill of clobetasol cream, which was sent to the pharmacy today. He will remain on Humira as prescribed.   Contracture of elbow joint, left: Unchanged.  No tenderness or inflammation noted.   Contracture of elbow joint, right: Unchanged.  No tenderness or inflammation noted.   Trigger finger, right index finger: He has intermittent locking and tenderness of the right index finger.  He will call to have a right trigger finger injection at his last office visit at which time he plans to take off work for several days afterward.    History of depression: He is not experiencing any symptoms of depression at this time.   Language barrier  Orders: Orders Placed This Encounter  Procedures  . CBC with Differential/Platelet  . COMPLETE METABOLIC PANEL WITH GFR   Meds ordered this encounter  Medications  . clobetasol cream (TEMOVATE) 0.05 %    Sig: Apply 1 application topically 2 (two) times daily.    Dispense:  60 g    Refill:  0     Follow-Up Instructions: Return in about 5 months (around 01/21/2021) for Rheumatoid arthritis, Osteoarthritis.   Gearldine Bienenstock, PA-C  Note - This record has been created using Dragon software.  Chart creation errors have been sought, but may not always  have been located. Such creation errors do not reflect on  the standard of medical care.

## 2020-08-16 HISTORY — PX: WISDOM TOOTH EXTRACTION: SHX21

## 2020-08-21 ENCOUNTER — Encounter: Payer: Self-pay | Admitting: Physician Assistant

## 2020-08-21 ENCOUNTER — Other Ambulatory Visit: Payer: Self-pay

## 2020-08-21 ENCOUNTER — Ambulatory Visit (INDEPENDENT_AMBULATORY_CARE_PROVIDER_SITE_OTHER): Payer: Managed Care, Other (non HMO) | Admitting: Physician Assistant

## 2020-08-21 VITALS — BP 127/82 | HR 85 | Resp 16 | Ht 66.0 in | Wt 149.0 lb

## 2020-08-21 DIAGNOSIS — M24522 Contracture, left elbow: Secondary | ICD-10-CM

## 2020-08-21 DIAGNOSIS — L409 Psoriasis, unspecified: Secondary | ICD-10-CM

## 2020-08-21 DIAGNOSIS — M19041 Primary osteoarthritis, right hand: Secondary | ICD-10-CM

## 2020-08-21 DIAGNOSIS — M0579 Rheumatoid arthritis with rheumatoid factor of multiple sites without organ or systems involvement: Secondary | ICD-10-CM

## 2020-08-21 DIAGNOSIS — M24521 Contracture, right elbow: Secondary | ICD-10-CM

## 2020-08-21 DIAGNOSIS — Z789 Other specified health status: Secondary | ICD-10-CM

## 2020-08-21 DIAGNOSIS — Z79899 Other long term (current) drug therapy: Secondary | ICD-10-CM

## 2020-08-21 DIAGNOSIS — M19042 Primary osteoarthritis, left hand: Secondary | ICD-10-CM

## 2020-08-21 DIAGNOSIS — M65321 Trigger finger, right index finger: Secondary | ICD-10-CM

## 2020-08-21 DIAGNOSIS — Z8659 Personal history of other mental and behavioral disorders: Secondary | ICD-10-CM

## 2020-08-21 MED ORDER — CLOBETASOL PROPIONATE 0.05 % EX CREA: 1.0000 "application " | TOPICAL_CREAM | Freq: Two times a day (BID) | CUTANEOUS | 0 refills | Status: AC

## 2020-08-21 NOTE — Patient Instructions (Signed)
Standing Labs We placed an order today for your standing lab work.   Please have your standing labs drawn in July and every 3 months   If possible, please have your labs drawn 2 weeks prior to your appointment so that the provider can discuss your results at your appointment.  We have open lab daily Monday through Thursday from 1:30-4:30 PM and Friday from 1:30-4:00 PM at the office of Dr. Shaili Deveshwar, [facility].   Please be advised, all patients with office appointments requiring lab work will take precedents over walk-in lab work.  If possible, please come for your lab work on Monday and Friday afternoons, as you may experience shorter wait times. The office is located at [address], [address], [place], [place] [address] No appointment is necessary.   Labs are drawn by [facility]. Please bring your co-pay at the time of your lab draw.  You may receive a bill from [facility] for your lab work.  If you wish to have your labs drawn at another location, please call the office 24 hours in advance to send orders.  If you have any questions regarding directions or hours of operation,  please call [phone].   As a reminder, please drink plenty of water prior to coming for your lab work. Thanks!     

## 2020-08-22 LAB — COMPLETE METABOLIC PANEL WITH GFR
AG Ratio: 1.2 (calc) (ref 1.0–2.5)
ALT: 25 U/L (ref 9–46)
AST: 25 U/L (ref 10–35)
Albumin: 4.2 g/dL (ref 3.6–5.1)
Alkaline phosphatase (APISO): 89 U/L (ref 35–144)
BUN: 19 mg/dL (ref 7–25)
CO2: 23 mmol/L (ref 20–32)
Calcium: 8.7 mg/dL (ref 8.6–10.3)
Chloride: 107 mmol/L (ref 98–110)
Creat: 0.99 mg/dL (ref 0.70–1.33)
GFR, Est African American: 102 mL/min/{1.73_m2} (ref >=60)
GFR, Est Non African American: 88 mL/min/{1.73_m2} (ref >=60)
Globulin: 3.6 g/dL (calc) (ref 1.9–3.7)
Glucose, Bld: 120 mg/dL — ABNORMAL HIGH (ref 65–99)
Potassium: 3.9 mmol/L (ref 3.5–5.3)
Sodium: 137 mmol/L (ref 135–146)
Total Bilirubin: 0.4 mg/dL (ref 0.2–1.2)
Total Protein: 7.8 g/dL (ref 6.1–8.1)

## 2020-08-22 LAB — CBC WITH DIFFERENTIAL/PLATELET
Absolute Monocytes: 511 cells/uL (ref 200–950)
Basophils Absolute: 37 cells/uL (ref 0–200)
Basophils Relative: 0.5 %
Eosinophils Absolute: 292 cells/uL (ref 15–500)
Eosinophils Relative: 4 %
HCT: 42.2 % (ref 38.5–50.0)
Hemoglobin: 14.4 g/dL (ref 13.2–17.1)
Lymphs Abs: 2343 cells/uL (ref 850–3900)
MCH: 28.2 pg (ref 27.0–33.0)
MCHC: 34.1 g/dL (ref 32.0–36.0)
MCV: 82.7 fL (ref 80.0–100.0)
MPV: 9.9 fL (ref 7.5–12.5)
Monocytes Relative: 7 %
Neutro Abs: 4117 cells/uL (ref 1500–7800)
Neutrophils Relative %: 56.4 %
Platelets: 221 10*3/uL (ref 140–400)
RBC: 5.1 10*6/uL (ref 4.20–5.80)
RDW: 12.9 % (ref 11.0–15.0)
Total Lymphocyte: 32.1 %
WBC: 7.3 10*3/uL (ref 3.8–10.8)

## 2020-08-22 NOTE — Progress Notes (Signed)
Glucose is 120.  Rest of CMP WNL.  CBC WNL.

## 2020-09-03 ENCOUNTER — Telehealth: Payer: Self-pay

## 2020-09-03 NOTE — Telephone Encounter (Addendum)
Please advise. I called patient to advise.

## 2020-09-03 NOTE — Telephone Encounter (Signed)
Ok to take an antihistamine for seasonal allergies while on humira.   If he develops symptoms of an infection he should hold humira and be evaluated by his PCP prior to resuming Humira.

## 2020-09-03 NOTE — Telephone Encounter (Signed)
Representative for patient left a voicemail stating Angad has bad allergy symptoms and wants to know if he can take allergy medication while on Humira.  Please call back at #[phone]

## 2020-09-20 ENCOUNTER — Ambulatory Visit: Payer: Managed Care, Other (non HMO) | Admitting: Rheumatology

## 2020-10-15 NOTE — Progress Notes (Signed)
Office Visit Note  Patient: Jeffrey Herman             Date of Birth: [birth year]           MRN: [id number]             PCP: Patient, No Pcp Per (Inactive) Referring: No ref. provider found Visit Date: 10/28/2020 Occupation: @GUAROCC @  Interpreter:  Subjective:  Medication management.   History of Present Illness: Jeffrey Herman is a 50 y.o. male with a history of rheumatoid arthritis.  He states he has been taking Humira every other week.  He has been tolerating the medication well.  He denies any joint pain or joint swelling.  He states he will be moving to [place] and would like to establish with a rheumatologist there.  Activities of Daily Living:  Patient reports morning stiffness for 0 minutes.   Patient Denies nocturnal pain.  Difficulty dressing/grooming: Denies Difficulty climbing stairs: Denies Difficulty getting out of chair: Denies Difficulty using hands for taps, buttons, cutlery, and/or writing: Denies  Review of Systems  Constitutional:  Negative for fatigue.  HENT:  Negative for mouth sores, mouth dryness and nose dryness.   Eyes:  Negative for pain, itching, visual disturbance and dryness.  Respiratory:  Negative for cough, hemoptysis, shortness of breath and difficulty breathing.   Cardiovascular:  Negative for chest pain, palpitations and swelling in legs/feet.  Gastrointestinal:  Negative for abdominal pain, blood in stool, constipation and diarrhea.  Endocrine: Negative for increased urination.  Genitourinary:  Negative for painful urination.  Musculoskeletal:  Negative for joint pain, joint pain, joint swelling, myalgias, muscle weakness, morning stiffness, muscle tenderness and myalgias.  Skin:  Negative for color change, rash and redness.  Allergic/Immunologic: Negative for susceptible to infections.  Neurological:  Negative for dizziness, numbness, headaches, memory loss and weakness.  Hematological:  Negative for swollen glands.   Psychiatric/Behavioral:  Negative for confusion and sleep disturbance.    PMFS History:  Patient Active Problem List   Diagnosis Date Noted   High risk medication use 08/12/2016   Primary osteoarthritis of both hands 08/12/2016   Rheumatoid arthritis (HCC) 12/22/2010   Depression 12/22/2010   Elevated C-reactive protein (CRP) 12/16/2010   Elevated sedimentation rate 12/11/2010   Arthralgia of multiple sites 12/11/2010    History reviewed. No pertinent past medical history.  History reviewed. No pertinent family history. Past Surgical History:  Procedure Laterality Date   WISDOM TOOTH EXTRACTION  08/16/2020   2 wisdom teeth    Social History   Social History Narrative   Not on file   Immunization History  Administered Date(s) Administered   Moderna Sars-Covid-2 Vaccination 08/05/2019, 09/02/2019, 07/05/2020     Objective: Vital Signs: BP 106/68 (BP Location: Left Arm, Patient Position: Sitting, Cuff Size: Normal)   Pulse 84   Ht 5\' 6"  (1.676 m)   Wt 145 lb 4.8 oz (65.9 kg)   BMI 23.45 kg/m    Physical Exam Vitals and nursing note reviewed.  Constitutional:      Appearance: He is well-developed.  HENT:     Head: Normocephalic and atraumatic.  Eyes:     Conjunctiva/sclera: Conjunctivae normal.     Pupils: Pupils are equal, round, and reactive to light.  Cardiovascular:     Rate and Rhythm: Normal rate and regular rhythm.     Heart sounds: Normal heart sounds.  Pulmonary:     Effort: Pulmonary effort is normal.     Breath sounds: Normal breath sounds.  Abdominal:     General: Bowel sounds are normal.     Palpations: Abdomen is soft.  Musculoskeletal:     Cervical back: Normal range of motion and neck supple.  Skin:    General: Skin is warm and dry.     Capillary Refill: Capillary refill takes less than 2 seconds.  Neurological:     Mental Status: He is alert and oriented to person, place, and time.  Psychiatric:        Behavior: Behavior normal.      Musculoskeletal Exam: C-spine was in good range of motion.  Shoulder joints, elbow joints, wrist joints, MCPs PIPs and DIPs with good range of motion with no synovitis.  The contractures in his elbows have resolved.  He did not have trigger finger today.  Hip joints, knee joints, ankles and MTPs with good range of motion with no synovitis.  CDAI Exam: CDAI Score: 0  Patient Global: 0 mm; Provider Global: 0 mm Swollen: 0 ; Tender: 0  Joint Exam 10/28/2020   No joint exam has been documented for this visit   There is currently no information documented on the homunculus. Go to the Rheumatology activity and complete the homunculus joint exam.  Investigation: No additional findings.  Imaging: No results found.  Recent Labs: Lab Results  Component Value Date   WBC 7.3 08/21/2020   HGB 14.4 08/21/2020   PLT 221 08/21/2020   NA 137 08/21/2020   K 3.9 08/21/2020   CL 107 08/21/2020   CO2 23 08/21/2020   GLUCOSE 120 (H) 08/21/2020   BUN 19 08/21/2020   CREATININE 0.99 08/21/2020   BILITOT 0.4 08/21/2020   ALKPHOS 72 08/12/2016   AST 25 08/21/2020   ALT 25 08/21/2020   PROT 7.8 08/21/2020   ALBUMIN 4.1 08/12/2016   CALCIUM 8.7 08/21/2020   GFRAA 102 08/21/2020   QFTBGOLDPLUS NEGATIVE 04/11/2018    Speciality Comments: Prior therapy: Sulfasalazine (elevated LFT's) and MTX (inadequate response)  Procedures:  No procedures performed Allergies: Patient has no known allergies.   Assessment / Plan:     Visit Diagnoses: Rheumatoid arthritis involving multiple sites with positive rheumatoid factor (HCC) - RF positive, CCP negative, ANA negative:  -He is clinically doing well with no synovitis on examination.  He has been tolerating Humira well.  He has been taking injections every other week.  He is moving to [place].  We will make a referral for him.  Plan: Ambulatory referral to Rheumatology  High risk medication use - Humira 40 mg sq injections every 14 days.  His labs have  been stable and normal.  We will get labs today as he will be moving.  I would also get a TB Gold.  Instructions were placed in the AVS and also explained by the interpreter to the patient regarding stopping Humira in case he develops an infection and then restart Humira once the infection resolves.  He has been advised to get an yearly skin examination to screen for nonmelanoma skin cancer.  He has been advised to get COVID-19 booster.  Other immunization update was also given.  I will refill Humira today.  He will get further refills from his future rheumatologist.  Primary osteoarthritis of both hands-he has some DIP and PIP prominence but not much discomfort.  Psoriasis-he has no active psoriasis lesions now.  History of depression  Language barrier-interpreter was present during the visit.  Orders: Orders Placed This Encounter  Procedures   CBC with Differential/Platelet  COMPLETE METABOLIC PANEL WITH GFR   QuantiFERON-TB Gold Plus   Ambulatory referral to Rheumatology    Meds ordered this encounter  Medications   HUMIRA PEN 40 MG/0.4ML PNKT    Sig: INJECT THE CONTENTS OF 1 PEN UNDER THE SKIN EVERY 14 DAYS    Dispense:  6 each    Refill:  0    Please fill 90 days supply.     Follow-Up Instructions: Return if symptoms worsen or fail to improve, for Rheumatoid arthritis.   Pollyann Savoy, MD  Note - This record has been created using Animal nutritionist.  Chart creation errors have been sought, but may not always  have been located. Such creation errors do not reflect on  the standard of medical care.

## 2020-10-28 ENCOUNTER — Ambulatory Visit: Payer: Managed Care, Other (non HMO) | Admitting: Rheumatology

## 2020-10-28 ENCOUNTER — Other Ambulatory Visit: Payer: Self-pay

## 2020-10-28 ENCOUNTER — Encounter: Payer: Self-pay | Admitting: Rheumatology

## 2020-10-28 VITALS — BP 106/68 | HR 84 | Ht 66.0 in | Wt 145.3 lb

## 2020-10-28 DIAGNOSIS — M65321 Trigger finger, right index finger: Secondary | ICD-10-CM

## 2020-10-28 DIAGNOSIS — L409 Psoriasis, unspecified: Secondary | ICD-10-CM | POA: Diagnosis not present

## 2020-10-28 DIAGNOSIS — M0579 Rheumatoid arthritis with rheumatoid factor of multiple sites without organ or systems involvement: Secondary | ICD-10-CM

## 2020-10-28 DIAGNOSIS — Z79899 Other long term (current) drug therapy: Secondary | ICD-10-CM

## 2020-10-28 DIAGNOSIS — M19041 Primary osteoarthritis, right hand: Secondary | ICD-10-CM

## 2020-10-28 DIAGNOSIS — M19042 Primary osteoarthritis, left hand: Secondary | ICD-10-CM

## 2020-10-28 DIAGNOSIS — M24521 Contracture, right elbow: Secondary | ICD-10-CM

## 2020-10-28 DIAGNOSIS — Z603 Acculturation difficulty: Secondary | ICD-10-CM

## 2020-10-28 DIAGNOSIS — Z8659 Personal history of other mental and behavioral disorders: Secondary | ICD-10-CM

## 2020-10-28 DIAGNOSIS — M24522 Contracture, left elbow: Secondary | ICD-10-CM

## 2020-10-28 DIAGNOSIS — Z789 Other specified health status: Secondary | ICD-10-CM

## 2020-10-28 MED ORDER — HUMIRA (2 PEN) 40 MG/0.4ML ~~LOC~~ AJKT: AUTO-INJECTOR | SUBCUTANEOUS | 0 refills | Status: AC

## 2020-10-28 NOTE — Patient Instructions (Addendum)
A referral was placed for [facility] Jannet Mantis M.D. [phone] in [place], [place].   Standing Labs We placed an order today for your standing lab work.   Please have your standing labs drawn in September and every 3 months  If possible, please have your labs drawn 2 weeks prior to your appointment so that the provider can discuss your results at your appointment.  Please note that you may see your imaging and lab results in MyChart before we have reviewed them. We may be awaiting multiple results to interpret others before contacting you. Please allow our office up to 72 hours to thoroughly review all of the results before contacting the office for clarification of your results.  We have open lab daily: Monday through Thursday from 1:30-4:30 PM and Friday from 1:30-4:00 PM at the office of Dr. Pollyann Savoy, [facility].   Please be advised, all patients with office appointments requiring lab work will take precedent over walk-in lab work.  If possible, please come for your lab work on Monday and Friday afternoons, as you may experience shorter wait times. The office is located at [address], [address], [place], [place] [address] No appointment is necessary.   Labs are drawn by [facility]. Please bring your co-pay at the time of your lab draw.  You may receive a bill from [facility] for your lab work.  If you wish to have your labs drawn at another location, please call the office 24 hours in advance to send orders.  If you have any questions regarding directions or hours of operation,  please call [phone].   As a reminder, please drink plenty of water prior to coming for your lab work. Thanks!   Vaccines You are taking a medication(s) that can suppress your immune system.  The following immunizations are recommended: Flu annually Covid-19  Td/Tdap (tetanus, diphtheria, pertussis) every 10 years Pneumonia (Prevnar 15 then Pneumovax 23 at least 1  year apart.  Alternatively, can take Prevnar 20 without needing additional dose) Shingrix (after age 68): 2 doses from 4 weeks to 6 months apart  Please check with your PCP to make sure you are up to date.   If you test POSITIVE for COVID19 and have MILD to MODERATE symptoms: First, call your PCP if you would like to receive COVID19 treatment AND Hold your medications during the infection and for at least 1 week after your symptoms have resolved: Injectable medication (Benlysta, Cimzia, Cosentyx, Enbrel, Humira, Orencia, Remicade, Simponi, Stelara, Taltz, Tremfya) Methotrexate Leflunomide (Arava) Mycophenolate (Cellcept) Harriette Ohara, Olumiant, or Rinvoq If you take Actemra or Kevzara, you DO NOT need to hold these for COVID19 infection.  If you test POSITIVE for COVID19 and have NO symptoms: First, call your PCP if you would like to receive COVID19 treatment AND Hold your medications for at least 10 days after the day that you tested positive Injectable medication (Benlysta, Cimzia, Cosentyx, Enbrel, Humira, Orencia, Remicade, Simponi, Stelara, Taltz, Tremfya) Methotrexate Leflunomide (Arava) Mycophenolate (Cellcept) Harriette Ohara, Olumiant, or Rinvoq If you take Actemra or Kevzara, you DO NOT need to hold these for COVID19 infection.   If you have signs or symptoms of an infection or start antibiotics: First, call your PCP for workup of your infection. Hold your medication through the infection, until you complete your antibiotics, and until symptoms resolve if you take the following: Injectable medication (Actemra, Benlysta, Cimzia, Cosentyx, Enbrel, Humira, Kevzara, Orencia, Remicade, Simponi, Stelara, Taltz, Tremfya) Methotrexate Leflunomide (Arava) Mycophenolate (Cellcept) Harriette Ohara,  Olumiant, or Rinvoq    .rheu

## 2020-10-28 NOTE — Progress Notes (Signed)
Patient is moving to [place] the following week. He currently takes Humira 40mg  every 14 days. He states he will have insurance for one month after moving. He does not have employment established at his new home therefore he would need to access Humira through patient assistance. He was provided with Humira patient assistance application today in . He has been advising to complete his portion and provide prescriber form to new rheumatology in [place].  I've advised him to schedule shipment of medication to his new home and as soon as possible while he still has coverage.  Patient verbalized understanding  [place], PharmD, MPH Clinical Pharmacist (Rheumatology and Pulmonology)

## 2020-10-29 NOTE — Progress Notes (Signed)
CBC is normal.  Glucose is mildly elevated.

## 2020-10-30 LAB — COMPLETE METABOLIC PANEL WITH GFR
AG Ratio: 1.2 (calc) (ref 1.0–2.5)
ALT: 23 U/L (ref 9–46)
AST: 27 U/L (ref 10–35)
Albumin: 4.2 g/dL (ref 3.6–5.1)
Alkaline phosphatase (APISO): 84 U/L (ref 35–144)
BUN: 17 mg/dL (ref 7–25)
CO2: 24 mmol/L (ref 20–32)
Calcium: 9.3 mg/dL (ref 8.6–10.3)
Chloride: 106 mmol/L (ref 98–110)
Creat: 0.71 mg/dL (ref 0.70–1.33)
GFR, Est African American: 126 mL/min/{1.73_m2} (ref >=60)
GFR, Est Non African American: 109 mL/min/{1.73_m2} (ref >=60)
Globulin: 3.4 g/dL (calc) (ref 1.9–3.7)
Glucose, Bld: 123 mg/dL — ABNORMAL HIGH (ref 65–99)
Potassium: 4.2 mmol/L (ref 3.5–5.3)
Sodium: 138 mmol/L (ref 135–146)
Total Bilirubin: 0.5 mg/dL (ref 0.2–1.2)
Total Protein: 7.6 g/dL (ref 6.1–8.1)

## 2020-10-30 LAB — CBC WITH DIFFERENTIAL/PLATELET
Absolute Monocytes: 485 cells/uL (ref 200–950)
Basophils Absolute: 29 cells/uL (ref 0–200)
Basophils Relative: 0.6 %
Eosinophils Absolute: 245 cells/uL (ref 15–500)
Eosinophils Relative: 5.1 %
HCT: 43.2 % (ref 38.5–50.0)
Hemoglobin: 14.5 g/dL (ref 13.2–17.1)
Lymphs Abs: 1992 cells/uL (ref 850–3900)
MCH: 27.7 pg (ref 27.0–33.0)
MCHC: 33.6 g/dL (ref 32.0–36.0)
MCV: 82.4 fL (ref 80.0–100.0)
MPV: 10.1 fL (ref 7.5–12.5)
Monocytes Relative: 10.1 %
Neutro Abs: 2050 cells/uL (ref 1500–7800)
Neutrophils Relative %: 42.7 %
Platelets: 200 10*3/uL (ref 140–400)
RBC: 5.24 10*6/uL (ref 4.20–5.80)
RDW: 13 % (ref 11.0–15.0)
Total Lymphocyte: 41.5 %
WBC: 4.8 10*3/uL (ref 3.8–10.8)

## 2020-10-30 LAB — QUANTIFERON-TB GOLD PLUS
Mitogen-NIL: >10 IU/mL
NIL: 0.04 IU/mL
QuantiFERON-TB Gold Plus: NEGATIVE
TB1-NIL: 0.02 IU/mL
TB2-NIL: 0.04 IU/mL

## 2020-11-12 ENCOUNTER — Ambulatory Visit: Payer: Managed Care, Other (non HMO) | Admitting: Rheumatology

## 2021-01-28 ENCOUNTER — Ambulatory Visit: Payer: Managed Care, Other (non HMO) | Admitting: Rheumatology
